# Patient Record
Sex: Female | Born: 1960
Health system: Southern US, Community
[De-identification: ages and names within clinical notes are randomized; demographics above are authoritative.]

## PROBLEM LIST (undated history)

## (undated) DIAGNOSIS — O99891 Other specified diseases and conditions complicating pregnancy: Secondary | ICD-10-CM

## (undated) DIAGNOSIS — Z8619 Personal history of other infectious and parasitic diseases: Secondary | ICD-10-CM

## (undated) DIAGNOSIS — F419 Anxiety disorder, unspecified: Secondary | ICD-10-CM

## (undated) DIAGNOSIS — R011 Cardiac murmur, unspecified: Secondary | ICD-10-CM

## (undated) DIAGNOSIS — N390 Urinary tract infection, site not specified: Secondary | ICD-10-CM

## (undated) DIAGNOSIS — I10 Essential (primary) hypertension: Secondary | ICD-10-CM

## (undated) DIAGNOSIS — O9989 Other specified diseases and conditions complicating pregnancy, childbirth and the puerperium: Secondary | ICD-10-CM

## (undated) HISTORY — DX: Other specified diseases and conditions complicating pregnancy, childbirth and the puerperium: O99.89

## (undated) HISTORY — PX: TONSILLECTOMY: SUR1361

## (undated) HISTORY — DX: Other specified diseases and conditions complicating pregnancy: O99.891

## (undated) HISTORY — DX: Essential (primary) hypertension: I10

## (undated) HISTORY — PX: ECTOPIC PREGNANCY SURGERY: SHX613

## (undated) HISTORY — DX: Anxiety disorder, unspecified: F41.9

## (undated) HISTORY — DX: Cardiac murmur, unspecified: R01.1

## (undated) HISTORY — DX: Personal history of other infectious and parasitic diseases: Z86.19

## (undated) HISTORY — PX: TONSILLECTOMY AND ADENOIDECTOMY: SUR1326

## (undated) HISTORY — DX: Urinary tract infection, site not specified: N39.0

---

## 2006-11-15 ENCOUNTER — Other Ambulatory Visit: Admission: RE | Admit: 2006-11-15 | Discharge: 2006-11-15 | Payer: Self-pay | Admitting: Gynecology

## 2006-11-16 ENCOUNTER — Encounter: Admission: RE | Admit: 2006-11-16 | Discharge: 2006-11-16 | Payer: Self-pay | Admitting: Gynecology

## 2008-11-14 ENCOUNTER — Encounter: Admission: RE | Admit: 2008-11-14 | Discharge: 2008-11-14 | Payer: Self-pay | Admitting: Gynecology

## 2010-09-04 ENCOUNTER — Other Ambulatory Visit: Payer: Self-pay | Admitting: Gynecology

## 2010-09-04 DIAGNOSIS — Z1231 Encounter for screening mammogram for malignant neoplasm of breast: Secondary | ICD-10-CM

## 2010-09-15 ENCOUNTER — Ambulatory Visit
Admission: RE | Admit: 2010-09-15 | Discharge: 2010-09-15 | Disposition: A | Discharge status: Home / Self Care | Payer: 59 | Source: Ambulatory Visit | Attending: Gynecology | Admitting: Gynecology

## 2010-09-15 DIAGNOSIS — Z1231 Encounter for screening mammogram for malignant neoplasm of breast: Secondary | ICD-10-CM

## 2011-05-11 LAB — HM PAP SMEAR: HM Pap smear: NORMAL

## 2011-05-24 LAB — HM MAMMOGRAPHY: HM MAMMO: NORMAL

## 2013-02-27 LAB — HM MAMMOGRAPHY

## 2013-05-22 ENCOUNTER — Telehealth: Payer: Self-pay

## 2013-05-22 NOTE — Telephone Encounter (Signed)
Medication and allergies:  Reviewed and updated  90 day supply/mail order: n/a Local pharmacy:  [facility] [address]     A/P: No changes to personal, family history or past surgical hx PAP-12/2011 per pt CCS-Never had one MMG-12/2011 per pt. Flu-02/07/13 Tdap-Not sure of when she last received vaccine   To Discuss with Provider: Pt. Was given a 14 day supply of Benicar at an Urgent Care.  Ran out of supply.  Would like a prescription.  Pt unsure of dosing.

## 2013-05-23 ENCOUNTER — Ambulatory Visit (INDEPENDENT_AMBULATORY_CARE_PROVIDER_SITE_OTHER): Payer: 59 | Admitting: Family Medicine

## 2013-05-23 ENCOUNTER — Encounter: Payer: Self-pay | Admitting: Family Medicine

## 2013-05-23 VITALS — BP 180/96 | HR 74 | Temp 98.1°F | Resp 16 | Ht 66.0 in | Wt 147.5 lb

## 2013-05-23 DIAGNOSIS — W57XXXA Bitten or stung by nonvenomous insect and other nonvenomous arthropods, initial encounter: Secondary | ICD-10-CM

## 2013-05-23 DIAGNOSIS — T148 Other injury of unspecified body region: Secondary | ICD-10-CM

## 2013-05-23 DIAGNOSIS — R012 Other cardiac sounds: Secondary | ICD-10-CM

## 2013-05-23 DIAGNOSIS — I10 Essential (primary) hypertension: Secondary | ICD-10-CM

## 2013-05-23 LAB — CBC WITH DIFFERENTIAL/PLATELET
BASOS PCT: 0.5 % (ref 0.0–3.0)
Basophils Absolute: 0 10*3/uL (ref 0.0–0.1)
EOS ABS: 0.2 10*3/uL (ref 0.0–0.7)
EOS PCT: 2.6 % (ref 0.0–5.0)
HCT: 36 % (ref 36.0–46.0)
Hemoglobin: 12.1 g/dL (ref 12.0–15.0)
LYMPHS PCT: 31.7 % (ref 12.0–46.0)
Lymphs Abs: 1.9 10*3/uL (ref 0.7–4.0)
MCHC: 33.5 g/dL (ref 30.0–36.0)
MCV: 90 fl (ref 78.0–100.0)
Monocytes Absolute: 0.4 10*3/uL (ref 0.1–1.0)
Monocytes Relative: 6.9 % (ref 3.0–12.0)
NEUTROS PCT: 58.3 % (ref 43.0–77.0)
Neutro Abs: 3.6 10*3/uL (ref 1.4–7.7)
Platelets: 206 10*3/uL (ref 150.0–400.0)
RBC: 4 Mil/uL (ref 3.87–5.11)
RDW: 15.2 % — ABNORMAL HIGH (ref 11.5–14.6)
WBC: 6.1 10*3/uL (ref 4.5–10.5)

## 2013-05-23 LAB — BASIC METABOLIC PANEL
BUN: 7 mg/dL (ref 6–23)
CHLORIDE: 102 meq/L (ref 96–112)
CO2: 27 mEq/L (ref 19–32)
Calcium: 9.2 mg/dL (ref 8.4–10.5)
Creatinine, Ser: 0.9 mg/dL (ref 0.4–1.2)
GFR: 83.01 mL/min (ref >60.00)
Glucose, Bld: 71 mg/dL (ref 70–99)
POTASSIUM: 3.3 meq/L — AB (ref 3.5–5.1)
Sodium: 136 mEq/L (ref 135–145)

## 2013-05-23 LAB — LIPID PANEL
CHOL/HDL RATIO: 2
Cholesterol: 157 mg/dL (ref 0–200)
HDL: 104.6 mg/dL (ref >39.00)
LDL CALC: 39 mg/dL (ref 0–99)
Triglycerides: 68 mg/dL (ref 0.0–149.0)
VLDL: 13.6 mg/dL (ref 0.0–40.0)

## 2013-05-23 LAB — HEPATIC FUNCTION PANEL
ALK PHOS: 54 U/L (ref 39–117)
ALT: 11 U/L (ref 0–35)
AST: 18 U/L (ref 0–37)
Albumin: 4.1 g/dL (ref 3.5–5.2)
BILIRUBIN DIRECT: 0 mg/dL (ref 0.0–0.3)
BILIRUBIN TOTAL: 0.5 mg/dL (ref 0.3–1.2)
Total Protein: 8 g/dL (ref 6.0–8.3)

## 2013-05-23 LAB — TSH: TSH: 1.17 u[IU]/mL (ref 0.35–5.50)

## 2013-05-23 MED ORDER — TRIAMCINOLONE ACETONIDE 0.1 % EX OINT
1.0000 | TOPICAL_OINTMENT | Freq: Two times a day (BID) | CUTANEOUS | Status: DC
Start: 2013-05-23 — End: 2014-08-23

## 2013-05-23 MED ORDER — OLMESARTAN MEDOXOMIL 20 MG PO TABS: 20.0000 mg | ORAL_TABLET | Freq: Every day | ORAL | Status: DC

## 2013-05-23 NOTE — Assessment & Plan Note (Signed)
New to provider, ongoing for pt.  Previously well controlled on Benicar but has since run out of meds.  Restart.  Check labs to risk stratify.  Reviewed supportive care and red flags that should prompt return.  Pt expressed understanding and is in agreement w/ plan.

## 2013-05-23 NOTE — Patient Instructions (Signed)
Follow up in 4-6 weeks to recheck BP Start the Benicar daily We'll notify you of your lab results and make any changes Use the Triamcinolone ointment twice daily on the bug bites Call with any questions or concerns Welcome!  We're glad to have you!!!

## 2013-05-23 NOTE — Progress Notes (Signed)
Pre visit review using our clinic review tool, if applicable. No additional management support is needed unless otherwise documented below in the visit note. 

## 2013-05-23 NOTE — Progress Notes (Signed)
   Subjective:    Patient ID: Abigail Johnson, female    DOB: Aug 21, 1960, 53 y.o.   MRN: [id number]  HPI New to establish.  Moved from [place] and has not had recent PCP.  GYN- Lomax (Retired)  HTN- chronic problem, was started on Benicar at [facility].  Was check BP while on meds and BP was well controlled.  Has since run out of meds and needs to restart.  No CP, SOB, HAs, visual changes, edema.  Rash- noted last week, very itching.  Predominately on L arm and lower back.  Few areas on lower R arm.  Not painful.  No one w/ similar itching.  Pt did travel over the holidays.  Pt slept on grandmother's couch and recently new dog has been lying on same couch.   Review of Systems For ROS see HPI     Objective:   Physical Exam  Vitals reviewed. Constitutional: She is oriented to person, place, and time. She appears well-developed and well-nourished. No distress.  HENT:  Head: Normocephalic and atraumatic.  Eyes: Conjunctivae and EOM are normal. Pupils are equal, round, and reactive to light.  Neck: Normal range of motion. Neck supple. No thyromegaly present.  Cardiovascular: Normal rate, regular rhythm, normal heart sounds and intact distal pulses.   No murmur heard. Pulmonary/Chest: Effort normal and breath sounds normal. No respiratory distress.  Abdominal: Soft. She exhibits no distension. There is no tenderness.  Musculoskeletal: She exhibits no edema.  Lymphadenopathy:    She has no cervical adenopathy.  Neurological: She is alert and oriented to person, place, and time.  Skin: Skin is warm and dry. Rash (scattered maculopapular lesions on L arm, lower back, and R lower arm.  central punctate scab consistent w/ bug bites) noted.  Psychiatric: She has a normal mood and affect. Her behavior is normal.          Assessment & Plan:

## 2013-05-23 NOTE — Assessment & Plan Note (Signed)
New.  Pt aware of dx and will be investigating whether new dog has fleas.  Triamcinolone ointment prn.  Pt expressed understanding and is in agreement w/ plan.

## 2013-05-24 ENCOUNTER — Encounter: Payer: Self-pay | Admitting: General Practice

## 2013-05-24 DIAGNOSIS — R012 Other cardiac sounds: Secondary | ICD-10-CM | POA: Insufficient documentation

## 2013-05-24 NOTE — Assessment & Plan Note (Signed)
Get ECHO to assess.

## 2013-05-24 NOTE — Progress Notes (Signed)
   Subjective:    Patient ID: Abigail Johnson, female    DOB: 11-12-1960, 53 y.o.   MRN: [id number]  HPI    Review of Systems     Objective:   Physical Exam  Cardiovascular:  Very loud S2 w/o murmur heard          Assessment & Plan:

## 2013-05-24 NOTE — Addendum Note (Signed)
Addended by: Midge Minium on: 05/24/2013 11:08 AM   Modules accepted: Orders

## 2013-05-30 ENCOUNTER — Encounter: Payer: Self-pay | Admitting: Cardiology

## 2013-05-30 ENCOUNTER — Ambulatory Visit (HOSPITAL_COMMUNITY): Payer: 59 | Attending: Family Medicine | Admitting: Radiology

## 2013-05-30 DIAGNOSIS — I10 Essential (primary) hypertension: Secondary | ICD-10-CM | POA: Insufficient documentation

## 2013-05-30 DIAGNOSIS — R012 Other cardiac sounds: Secondary | ICD-10-CM | POA: Insufficient documentation

## 2013-05-30 NOTE — Progress Notes (Signed)
Echocardiogram performed.  

## 2013-05-31 ENCOUNTER — Other Ambulatory Visit: Payer: Self-pay | Admitting: Family Medicine

## 2013-06-05 ENCOUNTER — Encounter: Payer: Self-pay | Admitting: General Practice

## 2013-07-02 ENCOUNTER — Encounter: Payer: Self-pay | Admitting: General Practice

## 2013-07-02 ENCOUNTER — Ambulatory Visit (INDEPENDENT_AMBULATORY_CARE_PROVIDER_SITE_OTHER): Payer: 59 | Admitting: Family Medicine

## 2013-07-02 ENCOUNTER — Encounter: Payer: Self-pay | Admitting: Family Medicine

## 2013-07-02 VITALS — BP 144/90 | HR 90 | Temp 98.2°F | Resp 16 | Wt 148.0 lb

## 2013-07-02 DIAGNOSIS — I10 Essential (primary) hypertension: Secondary | ICD-10-CM

## 2013-07-02 LAB — BASIC METABOLIC PANEL
BUN: 14 mg/dL (ref 6–23)
CO2: 25 mEq/L (ref 19–32)
Calcium: 8.9 mg/dL (ref 8.4–10.5)
Chloride: 107 mEq/L (ref 96–112)
Creatinine, Ser: 1 mg/dL (ref 0.4–1.2)
GFR: 78.05 mL/min (ref >60.00)
Glucose, Bld: 89 mg/dL (ref 70–99)
POTASSIUM: 3.8 meq/L (ref 3.5–5.1)
SODIUM: 137 meq/L (ref 135–145)

## 2013-07-02 MED ORDER — OLMESARTAN MEDOXOMIL 40 MG PO TABS: 40.0000 mg | ORAL_TABLET | Freq: Every day | ORAL | Status: DC

## 2013-07-02 NOTE — Assessment & Plan Note (Signed)
Improved since restarting Benicar but not at goal.  Will increase to 40mg  daily.  Repeat BMP to ensure normal K+ and Cr.  Will follow closely.

## 2013-07-02 NOTE — Progress Notes (Signed)
Pre visit review using our clinic review tool, if applicable. No additional management support is needed unless otherwise documented below in the visit note. 

## 2013-07-02 NOTE — Patient Instructions (Signed)
Follow up in 6 weeks for BP check Increase the Benicar to 40mg - 2 of what you have at home, 1 of the new prescription Keep up the good work!  You look great! Call with any questions or concerns [place]!

## 2013-07-02 NOTE — Progress Notes (Signed)
   Subjective:    Patient ID: Abigail Johnson, female    DOB: April 11, 1961, 53 y.o.   MRN: [id number]  HPI HTN- chronic problem, restarted Benicar at last visit.  BP is much improved.  No HAs, visual changes, CP, SOB, edema.   Review of Systems For ROS see HPI     Objective:   Physical Exam  Vitals reviewed. Constitutional: She is oriented to person, place, and time. She appears well-developed and well-nourished. No distress.  HENT:  Head: Normocephalic and atraumatic.  Eyes: Conjunctivae and EOM are normal. Pupils are equal, round, and reactive to light.  Neck: Normal range of motion. Neck supple. No thyromegaly present.  Cardiovascular: Normal rate, regular rhythm, normal heart sounds and intact distal pulses.   No murmur heard. Pulmonary/Chest: Effort normal and breath sounds normal. No respiratory distress.  Abdominal: Soft. She exhibits no distension. There is no tenderness.  Musculoskeletal: She exhibits no edema.  Lymphadenopathy:    She has no cervical adenopathy.  Neurological: She is alert and oriented to person, place, and time.  Skin: Skin is warm and dry.  Psychiatric: She has a normal mood and affect. Her behavior is normal.          Assessment & Plan:

## 2013-07-16 ENCOUNTER — Encounter: Payer: Self-pay | Admitting: General Practice

## 2013-08-29 ENCOUNTER — Ambulatory Visit (INDEPENDENT_AMBULATORY_CARE_PROVIDER_SITE_OTHER): Payer: 59 | Admitting: Internal Medicine

## 2013-08-29 ENCOUNTER — Encounter: Payer: Self-pay | Admitting: Internal Medicine

## 2013-08-29 VITALS — BP 168/108 | HR 69 | Temp 98.3°F | Wt 150.0 lb

## 2013-08-29 DIAGNOSIS — T148 Other injury of unspecified body region: Secondary | ICD-10-CM

## 2013-08-29 DIAGNOSIS — I10 Essential (primary) hypertension: Secondary | ICD-10-CM

## 2013-08-29 DIAGNOSIS — W57XXXA Bitten or stung by nonvenomous insect and other nonvenomous arthropods, initial encounter: Secondary | ICD-10-CM

## 2013-08-29 MED ORDER — PREDNISONE 10 MG PO TABS: 20.0000 mg | ORAL_TABLET | Freq: Every day | ORAL | Status: DC

## 2013-08-29 NOTE — Progress Notes (Signed)
   Subjective:    Patient ID: Abigail Johnson, female    DOB: [birth year], 53 y.o.   MRN: [id number]  DOS:  08/29/2013 Type of  visit: Acute visit Having hives, symptoms have been consistently after she visit her mother who lives in [place]. Symptoms started in December after her mom acquired a dog.. Typically, she developed hives, the next day they hive looks bigger and developed a small blister on top. Swelling gradually goes away    BP is noted to be elevated, see assessment and plan.   ROS Denies fever chills No joint aches No lip -tonge swelling.   Past Medical History  Diagnosis Date  . Hypertension   . History of chicken pox   . Heart murmur   . Blood transfusion complicating pregnancy   . UTI (urinary tract infection)     Past Surgical History  Procedure Laterality Date  . Tonsillectomy    . Tonsillectomy and adenoidectomy      History   Social History  . Marital Status: Single    Spouse Name: N/A    Number of Children: N/A  . Years of Education: N/A   Occupational History  . Not on file.   Social History Main Topics  . Smoking status: Never Smoker   . Smokeless tobacco: Not on file  . Alcohol Use: Yes  . Drug Use: No  . Sexual Activity: Yes   Other Topics Concern  . Not on file   Social History Narrative  . No narrative on file        Medication List       This list is accurate as of: 08/29/13  6:48 PM.  Always use your most recent med list.               olmesartan 40 MG tablet  Commonly known as:  BENICAR  Take 1 tablet (40 mg total) by mouth daily.     predniSONE 10 MG tablet  Commonly known as:  DELTASONE  Take 2 tablets (20 mg total) by mouth daily with breakfast.     triamcinolone ointment 0.1 %  Commonly known as:  KENALOG  Apply 1 application topically 2 (two) times daily.           Objective:   Physical Exam BP 168/108  Pulse 69  Temp(Src) 98.3 F (36.8 C)  Wt 150 lb (68.04 kg)  SpO2 100% General -- alert,  well-developed, NAD.  Skin-- Few lesions in different stages: some hives, other hives with a small blister in the center, some postinflammatory hyperpigmentation where she used to have a hive Psych-- Cognition and judgment appear intact. Cooperative with normal attention span and concentration. No anxious or depressed appearing.        Assessment & Plan:

## 2013-08-29 NOTE — Assessment & Plan Note (Signed)
BP slightly elevated today, ambulatory BPs per patient consistently 130/80. Of note she is not taking birth control pills, she is aware can not take benicar if pregnant

## 2013-08-29 NOTE — Progress Notes (Signed)
Pre visit review using our clinic review tool, if applicable. No additional management support is needed unless otherwise documented below in the visit note. 

## 2013-08-29 NOTE — Patient Instructions (Signed)
Take prednisone as prescribed for a few days Also recommend Claritin 10 mg OTC one tablet daily as needed Zantac 75 mg OTC 2 tablets daily as needed Both are excellent allergy medications that you should take when  you have problems.  If he ever have a severe reaction: Facial swelling, lip or tongue swelling or difficulty breathing ---> go to the ER   Check the  blood pressure 2 or 3 times a  week be sure it is between 110/60 and 140/85. Ideal blood pressure is 120/80. If it is consistently higher or lower, let me know

## 2013-08-29 NOTE — Assessment & Plan Note (Signed)
Hives vs flea bites  Symptoms started before she restarted BP medication, doubt related to benicar. Likely related to flea bites-bed bugs as they happen when she visit her mother. Recommend to get her mother's  pet and  house inspected. See instructions

## 2013-11-01 ENCOUNTER — Ambulatory Visit: Payer: 59 | Admitting: Family Medicine

## 2014-05-22 ENCOUNTER — Telehealth: Payer: Self-pay | Admitting: Family Medicine

## 2014-05-22 ENCOUNTER — Other Ambulatory Visit (HOSPITAL_COMMUNITY)
Admission: RE | Admit: 2014-05-22 | Discharge: 2014-05-22 | Disposition: A | Discharge status: Home / Self Care | Payer: 59 | Source: Ambulatory Visit | Attending: Medical | Admitting: Medical

## 2014-05-22 ENCOUNTER — Encounter: Payer: Self-pay | Admitting: Medical

## 2014-05-22 ENCOUNTER — Ambulatory Visit (INDEPENDENT_AMBULATORY_CARE_PROVIDER_SITE_OTHER): Payer: 59 | Admitting: Medical

## 2014-05-22 VITALS — BP 223/132 | HR 75 | Temp 97.0°F | Ht 66.0 in | Wt 151.0 lb

## 2014-05-22 DIAGNOSIS — I10 Essential (primary) hypertension: Secondary | ICD-10-CM

## 2014-05-22 DIAGNOSIS — N760 Acute vaginitis: Secondary | ICD-10-CM | POA: Insufficient documentation

## 2014-05-22 DIAGNOSIS — N3000 Acute cystitis without hematuria: Secondary | ICD-10-CM

## 2014-05-22 DIAGNOSIS — Z113 Encounter for screening for infections with a predominantly sexual mode of transmission: Secondary | ICD-10-CM | POA: Insufficient documentation

## 2014-05-22 DIAGNOSIS — N390 Urinary tract infection, site not specified: Secondary | ICD-10-CM | POA: Insufficient documentation

## 2014-05-22 DIAGNOSIS — R350 Frequency of micturition: Secondary | ICD-10-CM

## 2014-05-22 DIAGNOSIS — N898 Other specified noninflammatory disorders of vagina: Secondary | ICD-10-CM

## 2014-05-22 LAB — COMPREHENSIVE METABOLIC PANEL
ALT: 13 U/L (ref 0–35)
AST: 18 U/L (ref 0–37)
Albumin: 4 g/dL (ref 3.5–5.2)
Alkaline Phosphatase: 49 U/L (ref 39–117)
BILIRUBIN TOTAL: 0.4 mg/dL (ref 0.2–1.2)
BUN: 12 mg/dL (ref 6–23)
CO2: 25 meq/L (ref 19–32)
CREATININE: 0.85 mg/dL (ref 0.40–1.20)
Calcium: 9.1 mg/dL (ref 8.4–10.5)
Chloride: 103 mEq/L (ref 96–112)
GFR: 90.58 mL/min (ref >60.00)
GLUCOSE: 87 mg/dL (ref 70–99)
Potassium: 3.8 mEq/L (ref 3.5–5.1)
Sodium: 134 mEq/L — ABNORMAL LOW (ref 135–145)
Total Protein: 7.7 g/dL (ref 6.0–8.3)

## 2014-05-22 LAB — CBC WITH DIFFERENTIAL/PLATELET
BASOS PCT: 0.5 % (ref 0.0–3.0)
Basophils Absolute: 0 10*3/uL (ref 0.0–0.1)
EOS ABS: 0.1 10*3/uL (ref 0.0–0.7)
EOS PCT: 2.7 % (ref 0.0–5.0)
HEMATOCRIT: 35.5 % — AB (ref 36.0–46.0)
Hemoglobin: 11.6 g/dL — ABNORMAL LOW (ref 12.0–15.0)
LYMPHS ABS: 1.6 10*3/uL (ref 0.7–4.0)
Lymphocytes Relative: 32.2 % (ref 12.0–46.0)
MCHC: 32.8 g/dL (ref 30.0–36.0)
MCV: 90.3 fl (ref 78.0–100.0)
MONO ABS: 0.4 10*3/uL (ref 0.1–1.0)
Monocytes Relative: 8.6 % (ref 3.0–12.0)
Neutro Abs: 2.8 10*3/uL (ref 1.4–7.7)
Neutrophils Relative %: 56 % (ref 43.0–77.0)
PLATELETS: 195 10*3/uL (ref 150.0–400.0)
RBC: 3.93 Mil/uL (ref 3.87–5.11)
RDW: 15.5 % (ref 11.5–15.5)
WBC: 5 10*3/uL (ref 4.0–10.5)

## 2014-05-22 LAB — POCT URINALYSIS DIPSTICK
Glucose, UA: NEGATIVE
KETONES UA: NEGATIVE
Nitrite, UA: NEGATIVE
PH UA: 6
UROBILINOGEN UA: 0.2

## 2014-05-22 MED ORDER — CIPROFLOXACIN HCL 250 MG PO TABS: 250.0000 mg | ORAL_TABLET | Freq: Two times a day (BID) | ORAL | Status: DC

## 2014-05-22 MED ORDER — OLMESARTAN MEDOXOMIL-HCTZ 40-12.5 MG PO TABS: 1.0000 | ORAL_TABLET | Freq: Every day | ORAL | Status: DC

## 2014-05-22 MED ORDER — FLUCONAZOLE 150 MG PO TABS: 150.0000 mg | ORAL_TABLET | Freq: Once | ORAL | Status: DC

## 2014-05-22 NOTE — Assessment & Plan Note (Signed)
You do appear to have possible uti and has some symptoms consistent with last week. I will rx cipro for 3 days pending urine culture. Also will get urine ancillary studies. If yeast infection symptoms with antibiotic then prescribing diflucan.(making this available).

## 2014-05-22 NOTE — Telephone Encounter (Signed)
Caller name: Louanna, Vanliew Relationship to patient: self  Can be reached: [phone] [id number] [place], [place]. [phone] (Phone)     Reason for call: Pt states antibiotics was not sent over for her bladder infection

## 2014-05-22 NOTE — Progress Notes (Signed)
Subjective:    Patient ID: Abigail Johnson, female    DOB: [birth year], 54 y.o.   MRN: [id number]  HPI    Pt in today reporting urinary symptoms. Pt states one week had sensation of frequent urination but mild hesitant flow. Frequent urination. Those symptoms did resolve over the weekend. But still has odor to her urine. If she drinks a lot of water odor is diminished and looks clearer.    Past Medical History  Diagnosis Date  . Hypertension   . History of chicken pox   . Heart murmur   . Blood transfusion complicating pregnancy   . UTI (urinary tract infection)     History   Social History  . Marital Status: Single    Spouse Name: N/A    Number of Children: N/A  . Years of Education: N/A   Occupational History  . Not on file.   Social History Main Topics  . Smoking status: Never Smoker   . Smokeless tobacco: Not on file  . Alcohol Use: Yes  . Drug Use: No  . Sexual Activity: Yes   Other Topics Concern  . Not on file   Social History Narrative    Past Surgical History  Procedure Laterality Date  . Tonsillectomy    . Tonsillectomy and adenoidectomy      Family History  Problem Relation Age of Onset  . Hypertension Mother   . Diabetes Father   . Prostate cancer Paternal Grandfather     Allergies  Allergen Reactions  . Compazine [Prochlorperazine Maleate] Other (See Comments)    Muscle tension    Current Outpatient Prescriptions on File Prior to Visit  Medication Sig Dispense Refill  . olmesartan (BENICAR) 40 MG tablet Take 1 tablet (40 mg total) by mouth daily. 30 tablet 6  . predniSONE (DELTASONE) 10 MG tablet Take 2 tablets (20 mg total) by mouth daily with breakfast. 10 tablet 0  . triamcinolone ointment (KENALOG) 0.1 % Apply 1 application topically 2 (two) times daily. 90 g 1   No current facility-administered medications on file prior to visit.    BP 223/132 mmHg  Pulse 75  Temp(Src) 97 F (36.1 C) (Oral)  Ht 5\' 6"  (1.676 m)  Wt 151 lb  (68.493 kg)  BMI 24.38 kg/m2  SpO2 100%  LMP 05/11/2014    Dysuria- yes but now resolved. Frequent urination-Yes but now resolved. Hesitancy- Yes but that now resolved. Suprapubic pressure-No Fever-no chills-no Nausea-no Vomiting-no CVA pain-no History of UTI-2 urinary infection in the past. But 20 years ago Gross hematuria- No  See ros. On review of symptoms no cardiac signs or symptoms.  Pt states she checks her bp at work daily and her bp is 160-165/85-90. Yesterday 170/90. Pt states that her bp are always high here and pcp is aware of elevated readings here and her at work bp trends.  Pt ran out of her bp medication last month. And has appointment with Dr. Birdie Riddle in a month.  Previous was on benciar/hctz. She declined side effects. Not aware k was low. But has hx of that in the chart.    Review of Systems  Constitutional: Negative for fever, chills and fatigue.  Respiratory: Negative for cough, chest tightness, shortness of breath and wheezing.   Cardiovascular: Negative for chest pain and palpitations.  Gastrointestinal: Negative for abdominal pain.  Genitourinary: Positive for difficulty urinating. Negative for dysuria, frequency, hematuria, flank pain, vaginal bleeding and vaginal pain.       Urine odor  now. But did have dysuria, frequency, and hesitancy last week.   A little bit white discharge. But no icthing.  Neurological: Negative for dizziness, tremors, seizures, syncope, facial asymmetry, speech difficulty, weakness, light-headedness, numbness and headaches.  Hematological: Negative for adenopathy. Does not bruise/bleed easily.  Psychiatric/Behavioral: Negative for behavioral problems and confusion.       Objective:   Physical Exam    General Appearance- Not in acute distress.  HEENT Eyes- Scleraeral/Conjuntiva-bilat- Not Yellow. Mouth & Throat- Normal.  Chest and Lung Exam Auscultation: Breath sounds:-Normal. Adventitious sounds:- No  Adventitious sounds.  Cardiovascular Auscultation:Rythm - Regular. Heart Sounds -Normal heart sounds.  Abdomen Inspection:-Inspection Normal.  Palpation/Perucssion: Palpation and Percussion of the abdomen reveal- Non Tender, No Rebound tenderness, No rigidity(Guarding) and No Palpable abdominal masses.  Liver:-Normal.  Spleen:- Normal.   Back- no cva tenderness.  Neurologic Cranial Nerve exam:- CN III-XII intact(No nystagmus), symmetric smile. Drift Test:- No drift. Romberg Exam:- Negative.  Heal to Toe Gait exam:-Normal. Finger to Nose:- Normal/Intact Strength:- 5/5 equal and symmetric strength both upper and lower extremities.        Assessment & Plan:

## 2014-05-22 NOTE — Assessment & Plan Note (Signed)
With your blood pressure as high as it is I will get cmp today and start you back on benicar but add hctz. Make sure you are eating banana at least every other day to keep your k up.  If you get any cardiac or neuro signs or symptoms then ED eval.  Please call us tomorrow with at work bp readings. Those are lower and you have some reported degree of white coat hypertension.

## 2014-05-22 NOTE — Patient Instructions (Signed)
You do appear to have possible uti and has some symptoms consistent with last week. I will rx cipro for 3 days pending urine culture. Also will get urine ancillary studies. If yeast infection symptoms with antibiotic then prescribing diflucan.(making this available).  With your blood pressure as high as it is I will get cmp today and start you back on benicar but add hctz. Make sure you are eating banana at least every other day to keep your k up.  If you get any cardiac or neuro signs or symptoms then ED eval.  Please call us tomorrow with at work bp readings. Those are lower and you have some reported degree of white coat hypertension.   Follow up in 1 wk or as needed.

## 2014-05-22 NOTE — Progress Notes (Signed)
Pre visit review using our clinic review tool, if applicable. No additional management support is needed unless otherwise documented below in the visit note. 

## 2014-05-23 ENCOUNTER — Encounter: Payer: Self-pay | Admitting: Medical

## 2014-05-23 LAB — URINE CYTOLOGY ANCILLARY ONLY
CHLAMYDIA, DNA PROBE: NEGATIVE
NEISSERIA GONORRHEA: NEGATIVE
TRICH (WINDOWPATH): NEGATIVE

## 2014-05-23 NOTE — Telephone Encounter (Signed)
After review of pt bp I want her to come in tomorrow for eval. Will likely need to add other type med. Will send lpn note asking pt to come in on 15th.

## 2014-05-23 NOTE — Telephone Encounter (Signed)
Patient scheduled for 03/25/15 at 8:30.

## 2014-05-23 NOTE — Progress Notes (Unsigned)
Patient seen by E. Saguier on yesterday. Advised to call back with BP reading after she got back to work. Patient called with BP reading taken at 12:35 am = 189/120. Forwarded information to E. Prague for review.

## 2014-05-24 ENCOUNTER — Ambulatory Visit (INDEPENDENT_AMBULATORY_CARE_PROVIDER_SITE_OTHER): Payer: 59 | Admitting: Medical

## 2014-05-24 ENCOUNTER — Encounter: Payer: Self-pay | Admitting: Medical

## 2014-05-24 VITALS — BP 170/110 | HR 72 | Temp 98.4°F | Ht 66.0 in | Wt 148.0 lb

## 2014-05-24 DIAGNOSIS — I10 Essential (primary) hypertension: Secondary | ICD-10-CM

## 2014-05-24 LAB — URINE CULTURE

## 2014-05-24 LAB — URINE CYTOLOGY ANCILLARY ONLY: CANDIDA VAGINITIS: NEGATIVE

## 2014-05-24 MED ORDER — AMLODIPINE BESYLATE 10 MG PO TABS: 10.0000 mg | ORAL_TABLET | Freq: Every day | ORAL | Status: DC

## 2014-05-24 NOTE — Progress Notes (Signed)
Subjective:    Patient ID: Abigail Johnson, female    DOB: December 26, 1960, 54 y.o.   MRN: [id number]  HPI    Pt states last night her bp was 170/110. So some improvement but bp quite high recently as high as 223/132. I gave her extra 12.5 mg hctz. Pt has no cardiac or neurologic signs or symptoms.  Pt states in the past she only tried benicar/hctz combo.   Past Medical History  Diagnosis Date  . Hypertension   . History of chicken pox   . Heart murmur   . Blood transfusion complicating pregnancy   . UTI (urinary tract infection)     History   Social History  . Marital Status: Single    Spouse Name: N/A    Number of Children: N/A  . Years of Education: N/A   Occupational History  . Not on file.   Social History Main Topics  . Smoking status: Never Smoker   . Smokeless tobacco: Not on file  . Alcohol Use: Yes  . Drug Use: No  . Sexual Activity: Yes   Other Topics Concern  . Not on file   Social History Narrative    Past Surgical History  Procedure Laterality Date  . Tonsillectomy    . Tonsillectomy and adenoidectomy      Family History  Problem Relation Age of Onset  . Hypertension Mother   . Diabetes Father   . Prostate cancer Paternal Grandfather     Allergies  Allergen Reactions  . Compazine [Prochlorperazine Maleate] Other (See Comments)    Muscle tension    Current Outpatient Prescriptions on File Prior to Visit  Medication Sig Dispense Refill  . ciprofloxacin (CIPRO) 250 MG tablet Take 1 tablet (250 mg total) by mouth 2 (two) times daily. 6 tablet 0  . olmesartan-hydrochlorothiazide (BENICAR HCT) 40-12.5 MG per tablet Take 1 tablet by mouth daily. 30 tablet 3  . fluconazole (DIFLUCAN) 150 MG tablet Take 1 tablet (150 mg total) by mouth once. (Patient not taking: Reported on 05/24/2014) 1 tablet 0  . olmesartan (BENICAR) 40 MG tablet Take 1 tablet (40 mg total) by mouth daily. (Patient not taking: Reported on 05/24/2014) 30 tablet 6  . predniSONE  (DELTASONE) 10 MG tablet Take 2 tablets (20 mg total) by mouth daily with breakfast. (Patient not taking: Reported on 05/24/2014) 10 tablet 0  . triamcinolone ointment (KENALOG) 0.1 % Apply 1 application topically 2 (two) times daily. (Patient not taking: Reported on 05/24/2014) 90 g 1   No current facility-administered medications on file prior to visit.    BP 192/112 mmHg  Pulse 72  Temp(Src) 98.4 F (36.9 C) (Oral)  Ht 5\' 6"  (1.676 m)  Wt 148 lb (67.132 kg)  BMI 23.90 kg/m2  SpO2 97%  LMP 05/11/2014      Review of Systems  Constitutional: Negative for fever, chills, diaphoresis, activity change and fatigue.  Respiratory: Negative for cough, chest tightness and shortness of breath.   Cardiovascular: Negative for chest pain, palpitations and leg swelling.  Gastrointestinal: Negative for nausea, vomiting and abdominal pain.  Musculoskeletal: Negative for neck pain and neck stiffness.  Neurological: Negative for dizziness, tremors, seizures, syncope, facial asymmetry, speech difficulty, weakness, light-headedness, numbness and headaches.  Psychiatric/Behavioral: Negative for behavioral problems, confusion and agitation. The patient is not nervous/anxious.        Objective:   Physical Exam   General Mental Status- Alert. General Appearance- Not in acute distress.   Skin General: Color- Normal Color.  Moisture- Normal Moisture.  Neck Carotid Arteries- Normal color. Moisture- Normal Moisture. No carotid bruits. No JVD.  Chest and Lung Exam Auscultation: Breath Sounds:-Normal.  Cardiovascular Auscultation:Rythm- Regular. Murmurs & Other Heart Sounds:Auscultation of the heart reveals- No Murmurs.  Neurologic Cranial Nerve exam:- CN III-XII intact(No nystagmus), symmetric smile. Drift Test:- No drift. Romberg Exam:- Negative.  Heal to Toe Gait exam:-Normal. Finger to Nose:- Normal/Intact Strength:- 5/5 equal and symmetric strength both upper and lower  extremities.        Assessment & Plan:

## 2014-05-24 NOTE — Patient Instructions (Addendum)
Your bp is a lot better than the other day. I do think you would benefit from adding other medication and continuing with benicar/hctz. Adding amlodipine today.   Follow up Wednesday for bp check or as needed.  If any neurologic signs or symptoms after hour then ED eval. This is much less likely now that your bp is dropping.

## 2014-05-24 NOTE — Progress Notes (Signed)
Pre visit review using our clinic review tool, if applicable. No additional management support is needed unless otherwise documented below in the visit note. 

## 2014-05-24 NOTE — Assessment & Plan Note (Signed)
Improved but still quite high. So I added amlodipine 10 mg q day to current regimen. Follow up on this coming wed.

## 2014-05-29 ENCOUNTER — Ambulatory Visit: Payer: 59 | Admitting: Medical

## 2014-08-23 ENCOUNTER — Encounter: Payer: Self-pay | Admitting: Family Medicine

## 2014-08-23 ENCOUNTER — Other Ambulatory Visit: Payer: Self-pay | Admitting: General Practice

## 2014-08-23 ENCOUNTER — Ambulatory Visit (INDEPENDENT_AMBULATORY_CARE_PROVIDER_SITE_OTHER): Payer: 59 | Admitting: Family Medicine

## 2014-08-23 VITALS — BP 128/78 | HR 72 | Temp 98.0°F | Resp 16 | Wt 150.5 lb

## 2014-08-23 DIAGNOSIS — I10 Essential (primary) hypertension: Secondary | ICD-10-CM | POA: Diagnosis not present

## 2014-08-23 LAB — BASIC METABOLIC PANEL
BUN: 13 mg/dL (ref 6–23)
CALCIUM: 9.6 mg/dL (ref 8.4–10.5)
CHLORIDE: 101 meq/L (ref 96–112)
CO2: 26 meq/L (ref 19–32)
Creatinine, Ser: 1.05 mg/dL (ref 0.40–1.20)
GFR: 70.91 mL/min (ref >60.00)
Glucose, Bld: 70 mg/dL (ref 70–99)
Potassium: 3.5 mEq/L (ref 3.5–5.1)
SODIUM: 134 meq/L — AB (ref 135–145)

## 2014-08-23 MED ORDER — OLMESARTAN MEDOXOMIL-HCTZ 40-12.5 MG PO TABS: 1.0000 | ORAL_TABLET | Freq: Every day | ORAL | Status: DC

## 2014-08-23 NOTE — Assessment & Plan Note (Signed)
Chronic problem.  BP much improved since increasing Benicar to 40mg  daily.  Check BMP to ensure normal K+ and Cr.  Applauded pt's efforts.  No anticipated med changes.  Will follow.

## 2014-08-23 NOTE — Progress Notes (Signed)
Pre visit review using our clinic review tool, if applicable. No additional management support is needed unless otherwise documented below in the visit note. 

## 2014-08-23 NOTE — Patient Instructions (Signed)
Schedule your complete physical in 6 months We'll notify you of your lab results and make any changes if needed Keep up the good work!  You look great! Call with any questions or concerns [place]!!! 

## 2014-08-23 NOTE — Progress Notes (Signed)
   Subjective:    Patient ID: Forest Becker, female    DOB: [birth year], 54 y.o.   MRN: [id number]  HPI HTN- chronic problem.  BP is well controlled today since doubling the Benicar to 40mg  last visit.  No CP, SOB, HAs, visual changes, edema.   Review of Systems For ROS see HPI     Objective:   Physical Exam  Constitutional: She is oriented to person, place, and time. She appears well-developed and well-nourished. No distress.  HENT:  Head: Normocephalic and atraumatic.  Eyes: Conjunctivae and EOM are normal. Pupils are equal, round, and reactive to light.  Neck: Normal range of motion. Neck supple. No thyromegaly present.  Cardiovascular: Normal rate, regular rhythm, normal heart sounds and intact distal pulses.   No murmur heard. Pulmonary/Chest: Effort normal and breath sounds normal. No respiratory distress.  Abdominal: Soft. She exhibits no distension. There is no tenderness.  Musculoskeletal: She exhibits no edema.  Lymphadenopathy:    She has no cervical adenopathy.  Neurological: She is alert and oriented to person, place, and time.  Skin: Skin is warm and dry.  Psychiatric: She has a normal mood and affect. Her behavior is normal.  Vitals reviewed.         Assessment & Plan:

## 2015-02-07 ENCOUNTER — Telehealth: Payer: Self-pay | Admitting: Family Medicine

## 2015-02-07 NOTE — Telephone Encounter (Signed)
pre visit letter mailed 02/07/15 °

## 2015-02-27 ENCOUNTER — Telehealth: Payer: Self-pay | Admitting: Behavioral Health

## 2015-02-27 NOTE — Telephone Encounter (Signed)
Unable to reach patient at time of Pre-Visit Call.  The patient's voice mailbox is full; could not leave a message.

## 2015-02-28 ENCOUNTER — Ambulatory Visit (INDEPENDENT_AMBULATORY_CARE_PROVIDER_SITE_OTHER): Payer: 59 | Admitting: Family Medicine

## 2015-02-28 ENCOUNTER — Other Ambulatory Visit: Payer: Self-pay | Admitting: General Practice

## 2015-02-28 ENCOUNTER — Encounter: Payer: Self-pay | Admitting: Family Medicine

## 2015-02-28 VITALS — BP 124/84 | HR 60 | Temp 98.0°F | Resp 16 | Ht 66.0 in | Wt 153.4 lb

## 2015-02-28 DIAGNOSIS — Z0000 Encounter for general adult medical examination without abnormal findings: Secondary | ICD-10-CM | POA: Insufficient documentation

## 2015-02-28 DIAGNOSIS — Z1231 Encounter for screening mammogram for malignant neoplasm of breast: Secondary | ICD-10-CM

## 2015-02-28 DIAGNOSIS — L723 Sebaceous cyst: Secondary | ICD-10-CM

## 2015-02-28 DIAGNOSIS — Z1211 Encounter for screening for malignant neoplasm of colon: Secondary | ICD-10-CM

## 2015-02-28 DIAGNOSIS — Z01419 Encounter for gynecological examination (general) (routine) without abnormal findings: Secondary | ICD-10-CM

## 2015-02-28 LAB — CBC WITH DIFFERENTIAL/PLATELET
BASOS ABS: 0 10*3/uL (ref 0.0–0.1)
Basophils Relative: 0.6 % (ref 0.0–3.0)
EOS ABS: 0.1 10*3/uL (ref 0.0–0.7)
Eosinophils Relative: 3 % (ref 0.0–5.0)
HEMATOCRIT: 35.8 % — AB (ref 36.0–46.0)
HEMOGLOBIN: 11.8 g/dL — AB (ref 12.0–15.0)
LYMPHS PCT: 34.1 % (ref 12.0–46.0)
Lymphs Abs: 1.6 10*3/uL (ref 0.7–4.0)
MCHC: 32.9 g/dL (ref 30.0–36.0)
MCV: 92.1 fl (ref 78.0–100.0)
Monocytes Absolute: 0.3 10*3/uL (ref 0.1–1.0)
Monocytes Relative: 7.2 % (ref 3.0–12.0)
Neutro Abs: 2.7 10*3/uL (ref 1.4–7.7)
Neutrophils Relative %: 55.1 % (ref 43.0–77.0)
Platelets: 188 10*3/uL (ref 150.0–400.0)
RBC: 3.88 Mil/uL (ref 3.87–5.11)
RDW: 15.4 % (ref 11.5–15.5)
WBC: 4.8 10*3/uL (ref 4.0–10.5)

## 2015-02-28 LAB — HEPATIC FUNCTION PANEL
ALT: 10 U/L (ref 0–35)
AST: 16 U/L (ref 0–37)
Albumin: 4 g/dL (ref 3.5–5.2)
Alkaline Phosphatase: 43 U/L (ref 39–117)
Bilirubin, Direct: 0.1 mg/dL (ref 0.0–0.3)
TOTAL PROTEIN: 7.6 g/dL (ref 6.0–8.3)
Total Bilirubin: 0.5 mg/dL (ref 0.2–1.2)

## 2015-02-28 LAB — TSH: TSH: 1.44 u[IU]/mL (ref 0.35–4.50)

## 2015-02-28 LAB — BASIC METABOLIC PANEL
BUN: 10 mg/dL (ref 6–23)
CALCIUM: 9.4 mg/dL (ref 8.4–10.5)
CO2: 26 meq/L (ref 19–32)
CREATININE: 0.96 mg/dL (ref 0.40–1.20)
Chloride: 102 mEq/L (ref 96–112)
GFR: 78.48 mL/min (ref >60.00)
GLUCOSE: 86 mg/dL (ref 70–99)
Potassium: 4 mEq/L (ref 3.5–5.1)
Sodium: 135 mEq/L (ref 135–145)

## 2015-02-28 LAB — LIPID PANEL
Cholesterol: 139 mg/dL (ref 0–200)
HDL: 86.4 mg/dL (ref >39.00)
LDL Cholesterol: 42 mg/dL (ref 0–99)
NONHDL: 52.47
Total CHOL/HDL Ratio: 2
Triglycerides: 53 mg/dL (ref 0.0–149.0)
VLDL: 10.6 mg/dL (ref 0.0–40.0)

## 2015-02-28 LAB — VITAMIN D 25 HYDROXY (VIT D DEFICIENCY, FRACTURES): VITD: 13.71 ng/mL — ABNORMAL LOW (ref 30.00–100.00)

## 2015-02-28 MED ORDER — VITAMIN D (ERGOCALCIFEROL) 1.25 MG (50000 UNIT) PO CAPS: 50000.0000 [IU] | ORAL_CAPSULE | ORAL | Status: DC

## 2015-02-28 NOTE — Assessment & Plan Note (Signed)
Pt's PE WNL.  Due for mammo- ordered, pap- referred, colonoscopy- referred.  EKG done as baseline today- WNL.  Pt gets flu shot at work.  Check labs.  Anticipatory guidance provided.

## 2015-02-28 NOTE — Patient Instructions (Signed)
Follow up in 6 months to recheck BP We'll notify you of your lab results and make any changes if needed Keep up the good work on healthy diet and regular exercise- you look great! We'll call you with your dermatology, GYN, and GI appts We'll call you with your mammogram appt Call with any questions or concerns If you want to join [place] at the new [place] office, any scheduled appointments will automatically transfer and we will see you at [address] [place] [address] [place], [place] [address] [place]!!

## 2015-02-28 NOTE — Progress Notes (Signed)
Pre visit review using our clinic review tool, if applicable. No additional management support is needed unless otherwise documented below in the visit note. 

## 2015-02-28 NOTE — Progress Notes (Signed)
   Subjective:    Patient ID: Abigail Johnson, female    DOB: 07/26/1960, 54 y.o.   MRN: [id number]  HPI CPE- due for pap (pt wants referral to GYN).  Overdue on mammo [facility]).  Has never had colonoscopy.   Review of Systems Patient reports no vision/ hearing changes, adenopathy,fever, weight change,  persistant/recurrent hoarseness , swallowing issues, chest pain, palpitations, edema, persistant/recurrent cough, hemoptysis, dyspnea (rest/exertional/paroxysmal nocturnal), gastrointestinal bleeding (melena, rectal bleeding), abdominal pain, significant heartburn, bowel changes, GU symptoms (dysuria, hematuria, incontinence), Gyn symptoms (abnormal  bleeding, pain),  syncope, focal weakness, memory loss, numbness & tingling, hair/nail changes, abnormal bruising or bleeding, anxiety, or depression.   + sebaceous cyst of R shoulder    Objective:   Physical Exam General Appearance:    Alert, cooperative, no distress, appears stated age  Head:    Normocephalic, without obvious abnormality, atraumatic  Eyes:    PERRL, conjunctiva/corneas clear, EOM's intact, fundi    benign, both eyes  Ears:    Normal TM's and external ear canals, both ears  Nose:   Nares normal, septum midline, mucosa normal, no drainage    or sinus tenderness  Throat:   Lips, mucosa, and tongue normal; teeth and gums normal  Neck:   Supple, symmetrical, trachea midline, no adenopathy;    Thyroid: no enlargement/tenderness/nodules  Back:     Symmetric, no curvature, ROM normal, no CVA tenderness  Lungs:     Clear to auscultation bilaterally, respirations unlabored  Chest Wall:    No tenderness or deformity   Heart:    Regular rate and rhythm, S1 and S2 normal, no murmur, rub   or gallop  Breast Exam:    Deferred to GYN  Abdomen:     Soft, non-tender, bowel sounds active all four quadrants,    no masses, no organomegaly  Genitalia:    Deferred to GYN  Rectal:    Extremities:   Extremities normal, atraumatic, no  cyanosis or edema  Pulses:   2+ and symmetric all extremities  Skin:   Skin color, texture, turgor normal, no rashes or lesions.  Small sebaceous cyst on R shoulder  Lymph nodes:   Cervical, supraclavicular, and axillary nodes normal  Neurologic:   CNII-XII intact, normal strength, sensation and reflexes    throughout          Assessment & Plan:

## 2015-03-19 LAB — HM PAP SMEAR

## 2015-04-15 ENCOUNTER — Encounter: Payer: Self-pay | Admitting: General Practice

## 2015-06-01 ENCOUNTER — Other Ambulatory Visit: Payer: Self-pay | Admitting: Family Medicine

## 2015-06-02 NOTE — Telephone Encounter (Signed)
Medication filled to pharmacy as requested.   

## 2015-07-30 ENCOUNTER — Telehealth: Payer: Self-pay | Admitting: Family Medicine

## 2015-07-30 NOTE — Telephone Encounter (Signed)
Informed patient that OTC Azo is used to relieve symptoms of an UTI, but does not actually treat the infection. Advised the patient to increase fluids, at least 8-10 glasses a day and keep appointment for tomorrow w/ Dr. Lorelei Pont. Patient understood and did not have any further questions or concerns prior to the end of call.

## 2015-07-30 NOTE — Telephone Encounter (Signed)
Relation to [id number] Call back number:[phone] [id number] [place], [place]. [phone] ([phone] (Fax)         Reason for call:  Patient scheduled an appointment with Dr. Lorelei Pont tomorrow 07/31/15 at [place] for a possible bladder infection and would like to know if the OTC azo is good. Please advise

## 2015-07-31 ENCOUNTER — Telehealth: Payer: Self-pay | Admitting: Family Medicine

## 2015-07-31 ENCOUNTER — Ambulatory Visit: Payer: 59 | Admitting: Family Medicine

## 2015-08-01 ENCOUNTER — Encounter: Payer: Self-pay | Admitting: Family Medicine

## 2015-08-01 NOTE — Telephone Encounter (Signed)
-----   Message from Darreld Mclean, MD sent at 08/01/2015  2:00 PM EDT ----- Charge please

## 2015-08-01 NOTE — Telephone Encounter (Signed)
Pt was no show for acute appt 07/31/15 9:00am, pt has not rescheduled, charge or no charge?

## 2015-08-14 ENCOUNTER — Encounter: Payer: Self-pay | Admitting: Medical

## 2015-08-14 ENCOUNTER — Other Ambulatory Visit (HOSPITAL_COMMUNITY)
Admission: RE | Admit: 2015-08-14 | Discharge: 2015-08-14 | Disposition: A | Discharge status: Home / Self Care | Payer: 59 | Source: Ambulatory Visit | Attending: Medical | Admitting: Medical

## 2015-08-14 ENCOUNTER — Ambulatory Visit (INDEPENDENT_AMBULATORY_CARE_PROVIDER_SITE_OTHER): Payer: 59 | Admitting: Medical

## 2015-08-14 VITALS — BP 120/80 | HR 58 | Temp 98.3°F | Ht 66.0 in | Wt 153.4 lb

## 2015-08-14 DIAGNOSIS — L298 Other pruritus: Secondary | ICD-10-CM | POA: Diagnosis not present

## 2015-08-14 DIAGNOSIS — R350 Frequency of micturition: Secondary | ICD-10-CM | POA: Diagnosis not present

## 2015-08-14 DIAGNOSIS — N390 Urinary tract infection, site not specified: Secondary | ICD-10-CM

## 2015-08-14 DIAGNOSIS — R300 Dysuria: Secondary | ICD-10-CM

## 2015-08-14 DIAGNOSIS — N898 Other specified noninflammatory disorders of vagina: Secondary | ICD-10-CM

## 2015-08-14 DIAGNOSIS — R82998 Other abnormal findings in urine: Secondary | ICD-10-CM

## 2015-08-14 DIAGNOSIS — N760 Acute vaginitis: Secondary | ICD-10-CM | POA: Diagnosis present

## 2015-08-14 LAB — POC URINALSYSI DIPSTICK (AUTOMATED)
Bilirubin, UA: NEGATIVE
Blood, UA: NEGATIVE
Glucose, UA: NEGATIVE
Ketones, UA: NEGATIVE
NITRITE UA: NEGATIVE
PH UA: 6
PROTEIN UA: NEGATIVE
Spec Grav, UA: >=1.03
Urobilinogen, UA: 0.2

## 2015-08-14 MED ORDER — CIPROFLOXACIN HCL 500 MG PO TABS: 500.0000 mg | ORAL_TABLET | Freq: Two times a day (BID) | ORAL | Status: DC

## 2015-08-14 MED ORDER — FLUCONAZOLE 150 MG PO TABS: 150.0000 mg | ORAL_TABLET | Freq: Once | ORAL | Status: DC

## 2015-08-14 NOTE — Progress Notes (Signed)
Subjective:    Patient ID: Abigail Johnson, female    DOB: 1960/07/27, 55 y.o.   MRN: [id number]  HPI  Pt in today reporting urinary symptoms for one week.  Dysuria- slight burn Frequent urination-slight Hesitancy-no Suprapubic pressure- Fever-no chills-no Nausea-no Vomiting-no CVA pain-no  History of UTI- rare/occasional uti. Gross hematuria-no  MiIld odor to urine and mild cloudy Slight itch. No reported vaginal discharge.  Pt had no recent antibiotics.  Review of Systems  Constitutional: Negative for fever, chills and fatigue.  Respiratory: Negative for cough, shortness of breath and wheezing.   Cardiovascular: Negative for chest pain and palpitations.  Gastrointestinal: Negative for abdominal pain.  Genitourinary: Positive for dysuria and frequency. Negative for urgency, difficulty urinating, vaginal pain and pelvic pain.  Musculoskeletal: Negative for back pain.  Skin: Negative for rash.  Hematological: Negative for adenopathy. Does not bruise/bleed easily.   Past Medical History  Diagnosis Date  . Hypertension   . History of chicken pox   . Heart murmur   . Blood transfusion complicating pregnancy   . UTI (urinary tract infection)     Social History   Social History  . Marital Status: Single    Spouse Name: N/A  . Number of Children: N/A  . Years of Education: N/A   Occupational History  . Not on file.   Social History Main Topics  . Smoking status: Never Smoker   . Smokeless tobacco: Not on file  . Alcohol Use: Yes  . Drug Use: No  . Sexual Activity: Yes   Other Topics Concern  . Not on file   Social History Narrative    Past Surgical History  Procedure Laterality Date  . Tonsillectomy    . Tonsillectomy and adenoidectomy      Family History  Problem Relation Age of Onset  . Hypertension Mother   . Diabetes Father   . Prostate cancer Paternal Grandfather     Allergies  Allergen Reactions  . Compazine [Prochlorperazine  Maleate] Other (See Comments)    Muscle tension    Current Outpatient Prescriptions on File Prior to Visit  Medication Sig Dispense Refill  . BENICAR HCT 40-12.5 MG tablet TAKE 1 TABLET BY MOUTH DAILY. 90 tablet 1  . Vitamin D, Ergocalciferol, (DRISDOL) 50000 UNITS CAPS capsule Take 1 capsule (50,000 Units total) by mouth every 7 (seven) days. 4 capsule 2   No current facility-administered medications on file prior to visit.    BP 120/80 mmHg  Pulse 58  Temp(Src) 98.3 F (36.8 C) (Oral)  Ht 5\' 6"  (1.676 m)  Wt 153 lb 6.4 oz (69.582 kg)  BMI 24.77 kg/m2  SpO2 98%  LMP 07/04/2015      Objective:   Physical Exam   General  Mental Status- Alert. Orientation- Orientation x 4.   Skin General:- Normal. Moisture- Dry. Temperature- Warm.  HEENT Head- normal.  Neck Neck- Supple.  Heart Ausculation-RRR  Lungs Ausculation- Clear, even, unlabored bilaterlly.    Abdomen Palpation/Percussion: Palpation and Percussion of the abdomen reveal- Non Tender, No Rebound tenderness, No Rigidity(guarding), No Palpable abdominal masses and No jar tenderness. No suprapubic tenderness. Liver:-Normal. Spleen:- Normal. Other Characteristics- No Costovertebral angle tenderness- Left or Costovertebral angle tenderness- Right.  Auscultation: Auscultation of the abdomen reveals- Bowel Sounds normal.     Assessment & Plan:  You appear to have a urinary tract infection. I am prescribing  cipro antibiotic for the probable infection. Hydrate well. I am sending out a urine culture and getting ancillary studies.  During the interim if your signs and symptoms worsen rather than improving please notify us. We will notify your when the culture results are back.  Follow up in 7 days or as needed.

## 2015-08-14 NOTE — Progress Notes (Signed)
Pre visit review using our clinic review tool, if applicable. No additional management support is needed unless otherwise documented below in the visit note. 

## 2015-08-14 NOTE — Patient Instructions (Addendum)
You appear to have a urinary tract infection. I am prescribing  cipro antibiotic for the probable infection. Hydrate well. I am sending out a urine culture and getting ancillary studies. During the interim if your signs and symptoms worsen rather than improving please notify us. We will notify your when the culture results are back.  Follow up in 7 days or as needed.  Making diflucan if with antibiotic you get yeast infection.  Short course antibiotic pending urine studies.

## 2015-08-16 LAB — URINE CULTURE: Colony Count: >=100000

## 2015-08-17 ENCOUNTER — Telehealth: Payer: Self-pay | Admitting: Medical

## 2015-08-17 MED ORDER — CIPROFLOXACIN HCL 500 MG PO TABS: 500.0000 mg | ORAL_TABLET | Freq: Two times a day (BID) | ORAL | Status: DC

## 2015-08-17 NOTE — Telephone Encounter (Signed)
rx 4 more days cipro sent in.

## 2015-08-18 LAB — URINE CYTOLOGY ANCILLARY ONLY: TRICH (WINDOWPATH): NEGATIVE

## 2015-08-18 NOTE — Progress Notes (Signed)
Quick Note:  Pt has seen results on MyChart and message also sent for patient to call back if any questions. ______ 

## 2015-08-19 NOTE — Progress Notes (Signed)
Quick Note:  Pt has seen results on MyChart and message also sent for patient to call back if any questions. ______ 

## 2015-08-21 LAB — URINE CYTOLOGY ANCILLARY ONLY
Bacterial vaginitis: POSITIVE — AB
Candida vaginitis: POSITIVE — AB

## 2015-08-24 MED ORDER — FLUCONAZOLE 150 MG PO TABS
ORAL_TABLET | ORAL | Status: DC
Start: 2015-08-24 — End: 2015-08-29

## 2015-08-24 MED ORDER — METRONIDAZOLE 500 MG PO TABS: 500.0000 mg | ORAL_TABLET | Freq: Two times a day (BID) | ORAL | Status: DC

## 2015-08-24 MED ORDER — FLUCONAZOLE 150 MG PO TABS: 150.0000 mg | ORAL_TABLET | Freq: Once | ORAL | Status: DC

## 2015-08-24 NOTE — Telephone Encounter (Signed)
Pt had bv on ancillary studies and yeast infection. So I will rx flagyl and diflucan.

## 2015-08-25 NOTE — Telephone Encounter (Signed)
Attempted to reach pt and VM is full. Will send a MyChart message to patient with the information from provider.

## 2015-08-29 ENCOUNTER — Ambulatory Visit (INDEPENDENT_AMBULATORY_CARE_PROVIDER_SITE_OTHER): Payer: 59 | Admitting: Family Medicine

## 2015-08-29 ENCOUNTER — Encounter: Payer: Self-pay | Admitting: Family Medicine

## 2015-08-29 VITALS — BP 118/80 | HR 82 | Temp 97.9°F | Resp 16 | Ht 66.0 in | Wt 151.5 lb

## 2015-08-29 DIAGNOSIS — N390 Urinary tract infection, site not specified: Secondary | ICD-10-CM

## 2015-08-29 DIAGNOSIS — N3000 Acute cystitis without hematuria: Secondary | ICD-10-CM | POA: Diagnosis not present

## 2015-08-29 DIAGNOSIS — I10 Essential (primary) hypertension: Secondary | ICD-10-CM

## 2015-08-29 LAB — CBC WITH DIFFERENTIAL/PLATELET
BASOS ABS: 0 10*3/uL (ref 0.0–0.1)
Basophils Relative: 0.6 % (ref 0.0–3.0)
EOS PCT: 4.4 % (ref 0.0–5.0)
Eosinophils Absolute: 0.2 10*3/uL (ref 0.0–0.7)
HEMATOCRIT: 36.2 % (ref 36.0–46.0)
Hemoglobin: 11.9 g/dL — ABNORMAL LOW (ref 12.0–15.0)
LYMPHS PCT: 39.6 % (ref 12.0–46.0)
Lymphs Abs: 1.7 10*3/uL (ref 0.7–4.0)
MCHC: 33 g/dL (ref 30.0–36.0)
MCV: 91.5 fl (ref 78.0–100.0)
MONOS PCT: 6.6 % (ref 3.0–12.0)
Monocytes Absolute: 0.3 10*3/uL (ref 0.1–1.0)
NEUTROS ABS: 2.1 10*3/uL (ref 1.4–7.7)
Neutrophils Relative %: 48.8 % (ref 43.0–77.0)
PLATELETS: 217 10*3/uL (ref 150.0–400.0)
RBC: 3.95 Mil/uL (ref 3.87–5.11)
RDW: 15.3 % (ref 11.5–15.5)
WBC: 4.3 10*3/uL (ref 4.0–10.5)

## 2015-08-29 LAB — POCT URINALYSIS DIPSTICK
Bilirubin, UA: NEGATIVE
GLUCOSE UA: NEGATIVE
Ketones, UA: NEGATIVE
LEUKOCYTES UA: NEGATIVE
NITRITE UA: NEGATIVE
PROTEIN UA: NEGATIVE
SPEC GRAV UA: 1.015
UROBILINOGEN UA: 0.2
pH, UA: 6.5

## 2015-08-29 LAB — BASIC METABOLIC PANEL
BUN: 16 mg/dL (ref 6–23)
CALCIUM: 9.7 mg/dL (ref 8.4–10.5)
CHLORIDE: 100 meq/L (ref 96–112)
CO2: 28 meq/L (ref 19–32)
Creatinine, Ser: 1.02 mg/dL (ref 0.40–1.20)
GFR: 73.03 mL/min (ref >60.00)
Glucose, Bld: 74 mg/dL (ref 70–99)
POTASSIUM: 3.9 meq/L (ref 3.5–5.1)
SODIUM: 136 meq/L (ref 135–145)

## 2015-08-29 NOTE — Patient Instructions (Signed)
Schedule your complete physical in 6 months We'll notify you of your lab results and make any changes if needed Pick up the Flagyl and Diflucan at the pharmacy and take as directed Call with any questions or concerns Keep up the good work!  You look great!! Thanks for sticking with [place]!!! Happy Spring!!!

## 2015-08-29 NOTE — Assessment & Plan Note (Signed)
Pt reports discomfort is better but we will send urine for culture for TOC.  She is to pick up the diflucan and flagyl at the pharmacy for the BV and yeast.  Pt expressed understanding and is in agreement w/ plan.

## 2015-08-29 NOTE — Assessment & Plan Note (Signed)
Chronic problem.  Well controlled.  Asymptomatic.  Applauded her efforts at exercise.  Check labs.  No anticipated med changes.

## 2015-08-29 NOTE — Progress Notes (Signed)
Pre visit review using our clinic review tool, if applicable. No additional management support is needed unless otherwise documented below in the visit note. 

## 2015-08-29 NOTE — Progress Notes (Signed)
   Subjective:    Patient ID: Abigail Johnson, female    DOB: [birth year], 55 y.o.   MRN: [id number]  HPI HTN- chronic problem, on Benicar HCT w/ good control today.  Exercising regularly.  Denies CP, SOB, HAs, visual changes, edema.  Urine discomfort- pt reports the burning is better but continues to have itching.  Urine from 4/6 shows yeast and BV and pt has script for both diflucan and flagyl at pharmacy.   Review of Systems For ROS see HPI     Objective:   Physical Exam  Constitutional: She is oriented to person, place, and time. She appears well-developed and well-nourished. No distress.  HENT:  Head: Normocephalic and atraumatic.  Eyes: Conjunctivae and EOM are normal. Pupils are equal, round, and reactive to light.  Neck: Normal range of motion. Neck supple. No thyromegaly present.  Cardiovascular: Normal rate, regular rhythm, normal heart sounds and intact distal pulses.   No murmur heard. Pulmonary/Chest: Effort normal and breath sounds normal. No respiratory distress.  Abdominal: Soft. She exhibits no distension. There is no tenderness.  Musculoskeletal: She exhibits no edema.  Lymphadenopathy:    She has no cervical adenopathy.  Neurological: She is alert and oriented to person, place, and time.  Skin: Skin is warm and dry.  Psychiatric: She has a normal mood and affect. Her behavior is normal.  Vitals reviewed.         Assessment & Plan:

## 2015-08-31 LAB — URINE CULTURE
COLONY COUNT: NO GROWTH
ORGANISM ID, BACTERIA: NO GROWTH

## 2015-09-10 ENCOUNTER — Telehealth: Payer: Self-pay | Admitting: Family Medicine

## 2015-09-10 MED ORDER — OLMESARTAN MEDOXOMIL-HCTZ 40-12.5 MG PO TABS: 1.0000 | ORAL_TABLET | Freq: Every day | ORAL | Status: DC

## 2015-09-10 NOTE — Telephone Encounter (Signed)
Medication filled to pharmacy as requested.   

## 2015-09-10 NOTE — Telephone Encounter (Signed)
[facility] [address]

## 2016-03-02 ENCOUNTER — Encounter: Payer: 59 | Admitting: Family Medicine

## 2016-03-25 ENCOUNTER — Telehealth: Payer: Self-pay | Admitting: Family Medicine

## 2016-03-25 NOTE — Telephone Encounter (Signed)
Ok to switch 

## 2016-03-25 NOTE — Telephone Encounter (Signed)
ok 

## 2016-03-25 NOTE — Telephone Encounter (Signed)
Pt would like to switch from Dr. Birdie Riddle to Dr Lorelei Pont. Pt says that she is closer to the [facility] office.     Is switch okay?

## 2016-03-26 ENCOUNTER — Ambulatory Visit (INDEPENDENT_AMBULATORY_CARE_PROVIDER_SITE_OTHER): Payer: 59 | Admitting: Medical

## 2016-03-26 ENCOUNTER — Other Ambulatory Visit (HOSPITAL_COMMUNITY)
Admission: RE | Admit: 2016-03-26 | Discharge: 2016-03-26 | Disposition: A | Discharge status: Home / Self Care | Payer: 59 | Source: Ambulatory Visit | Attending: Medical | Admitting: Medical

## 2016-03-26 ENCOUNTER — Encounter: Payer: Self-pay | Admitting: Medical

## 2016-03-26 VITALS — BP 100/68 | HR 77 | Temp 98.1°F | Ht 66.0 in | Wt 152.0 lb

## 2016-03-26 DIAGNOSIS — N898 Other specified noninflammatory disorders of vagina: Secondary | ICD-10-CM

## 2016-03-26 DIAGNOSIS — B379 Candidiasis, unspecified: Secondary | ICD-10-CM

## 2016-03-26 DIAGNOSIS — Z113 Encounter for screening for infections with a predominantly sexual mode of transmission: Secondary | ICD-10-CM | POA: Insufficient documentation

## 2016-03-26 LAB — POCT URINALYSIS DIPSTICK
Bilirubin, UA: NEGATIVE
Glucose, UA: NEGATIVE
Ketones, UA: NEGATIVE
Leukocytes, UA: NEGATIVE
Nitrite, UA: NEGATIVE
PROTEIN UA: NEGATIVE
RBC UA: NEGATIVE
SPEC GRAV UA: 1.02
UROBILINOGEN UA: 4
pH, UA: 6.5

## 2016-03-26 MED ORDER — FLUCONAZOLE 150 MG PO TABS: 150.0000 mg | ORAL_TABLET | Freq: Once | ORAL | 0 refills | Status: AC

## 2016-03-26 NOTE — Progress Notes (Signed)
Subjective:    Patient ID: Abigail Johnson, female    DOB: 1960-06-15, 55 y.o.   MRN: [id number]  HPI   Pt in for some thick white discharge. Some recent itching. Symptoms for one week. Monistat helped just a little then symptoms came back the same. No recent antibiotic. Some odor to dc.   No fever, no chills or sweats. No back pain.   No frequent urination.   Review of Systems  Constitutional: Negative for chills, fatigue and fever.  Respiratory: Negative for cough, chest tightness, shortness of breath and wheezing.   Cardiovascular: Negative for chest pain and palpitations.  Gastrointestinal: Negative for abdominal distention, abdominal pain, blood in stool, constipation, rectal pain and vomiting.  Genitourinary: Positive for vaginal discharge. Negative for difficulty urinating, dysuria, flank pain, genital sores, menstrual problem and urgency.       Vaginal itch white dc.  Musculoskeletal: Negative for back pain.  Skin: Negative for rash.  Hematological: Negative for adenopathy. Does not bruise/bleed easily.    Past Medical History:  Diagnosis Date  . Blood transfusion complicating pregnancy   . Heart murmur   . History of chicken pox   . Hypertension   . UTI (urinary tract infection)      Social History   Social History  . Marital status: Single    Spouse name: N/A  . Number of children: N/A  . Years of education: N/A   Occupational History  . Not on file.   Social History Main Topics  . Smoking status: Never Smoker  . Smokeless tobacco: Not on file  . Alcohol use Yes  . Drug use: No  . Sexual activity: Yes   Other Topics Concern  . Not on file   Social History Narrative  . No narrative on file    Past Surgical History:  Procedure Laterality Date  . TONSILLECTOMY    . TONSILLECTOMY AND ADENOIDECTOMY      Family History  Problem Relation Age of Onset  . Hypertension Mother   . Diabetes Father   . Prostate cancer Paternal Grandfather      Allergies  Allergen Reactions  . Compazine [Prochlorperazine Maleate] Other (See Comments)    Muscle tension    Current Outpatient Prescriptions on File Prior to Visit  Medication Sig Dispense Refill  . olmesartan-hydrochlorothiazide (BENICAR HCT) 40-12.5 MG tablet Take 1 tablet by mouth daily. 90 tablet 1   No current facility-administered medications on file prior to visit.     BP 100/68 (BP Location: Left Arm, Patient Position: Sitting, Cuff Size: Normal)   Pulse 77   Temp 98.1 F (36.7 C) (Oral)   Ht 5\' 6"  (1.676 m)   Wt 152 lb (68.9 kg)   LMP 03/11/2016   SpO2 98%   BMI 24.53 kg/m       Objective:   Physical Exam  General Appearance- Not in acute distress.  HEENT Eyes- Scleraeral/Conjuntiva-bilat- Not Yellow. Mouth & Throat- Normal.  Chest and Lung Exam Auscultation: Breath sounds:-Normal. Adventitious sounds:- No Adventitious sounds.  Cardiovascular Auscultation:Rythm - Regular. Heart Sounds -Normal heart sounds.  Abdomen Inspection:-Inspection Normal.  Palpation/Perucssion: Palpation and Percussion of the abdomen reveal- Non Tender(no suprapubic tenderness), No Rebound tenderness, No rigidity(Guarding) and No Palpable abdominal masses.  Liver:-Normal.  Spleen:- Normal.   Back- no cva tenderness.       Assessment & Plan:  For probable yeast infection will rx diflucan tablet. Will do urine ancillary and culture testing to see if any other potential cause including  BV.  Follow up 5 days or as needed

## 2016-03-26 NOTE — Patient Instructions (Addendum)
For probable yeast infection will rx diflucan tablet. Will do urine ancillary and culture testing to see if any other potential cause including BV.  Follow up 5 days or as needed

## 2016-03-26 NOTE — Progress Notes (Signed)
Pre visit review using our clinic review tool, if applicable. No additional management support is needed unless otherwise documented below in the visit note. 

## 2016-03-27 LAB — URINE CULTURE

## 2016-03-29 LAB — URINE CYTOLOGY ANCILLARY ONLY
CHLAMYDIA, DNA PROBE: NEGATIVE
Neisseria Gonorrhea: NEGATIVE
Trichomonas: NEGATIVE

## 2016-03-29 NOTE — Telephone Encounter (Signed)
I wanted gardnerella and candidiasis done. Is that study pending?

## 2016-03-30 ENCOUNTER — Telehealth: Payer: Self-pay | Admitting: Medical

## 2016-03-30 NOTE — Telephone Encounter (Signed)
Called pt to schedule her an appt to transition care from [place] to [place]. No answer. vm is to full, unable to lvm.

## 2016-03-30 NOTE — Telephone Encounter (Signed)
Will you see if bv and candida was ordered. It appears it was not ordered.

## 2016-03-30 NOTE — Telephone Encounter (Signed)
If these tests are ordered they are sent out to another lab after all the other tests are resulted so it takes a longer turn around time for Gardnerella & Candidiasis to come back.(about 5 or 6 days) Peter Congo at [employer] has confirmed this before.

## 2016-03-30 NOTE — Telephone Encounter (Signed)
Reviewed to see if candida and bv ordered. Will send message to lab to recheck.

## 2016-03-31 ENCOUNTER — Telehealth: Payer: Self-pay | Admitting: Medical

## 2016-03-31 NOTE — Telephone Encounter (Signed)
Pt has some vaginal itching despite 2 diflucan. Her test were negative but appears bv and candida were not done. Not suture why since I thought I ordered. Will you call pt on monday and have her come in She could just give sample and not be seen.

## 2016-03-31 NOTE — Telephone Encounter (Signed)
Caller name: Denia  Relation to pt: self Call back number: [phone] Pharmacy:  Reason for call: Pt called stating that PCP saw her on 03-26-16 (for vaginal odor), and that PCP wanted her to call the office to give an update with him or nurse about her issue that she came in on Friday (03-26-16). Please advise ASAP.

## 2016-03-31 NOTE — Telephone Encounter (Signed)
Attempted to reach the patient and the message states "the mailbox is full and is not able to take messages at this time.

## 2016-03-31 NOTE — Telephone Encounter (Signed)
Spoke with pt. She states she took both diflucan tablets and continues to have some vaginal itching. If further [facility] [address].  Please advise?

## 2016-04-05 ENCOUNTER — Telehealth: Payer: Self-pay | Admitting: Medical

## 2016-04-05 LAB — URINE CYTOLOGY ANCILLARY ONLY
BACTERIAL VAGINITIS: POSITIVE — AB
CANDIDA VAGINITIS: NEGATIVE

## 2016-04-05 MED ORDER — METRONIDAZOLE 500 MG PO TABS: 500.0000 mg | ORAL_TABLET | Freq: Two times a day (BID) | ORAL | 0 refills | Status: DC

## 2016-04-05 NOTE — Telephone Encounter (Signed)
rx flagyl sent to her pharmacy.

## 2016-04-05 NOTE — Telephone Encounter (Signed)
I spoke with Abigail Johnson at [facility] and the test's are in progress. Please note that these tests take longer than the other tests.(about 5 days after you get the first results) Once the first set are resulted the [facility] get sent out for testing. Because of the Holiday and weekend results took even longer. This patient should not come in to provide another specimen.

## 2016-04-05 NOTE — Telephone Encounter (Signed)
Results for Bv & Candida are now in Epic. KMP

## 2016-04-05 NOTE — Telephone Encounter (Signed)
Noted  

## 2016-04-06 NOTE — Progress Notes (Signed)
Pt has seen results on MyChart and message also sent for patient to call back if any questions.

## 2016-09-15 DIAGNOSIS — N390 Urinary tract infection, site not specified: Secondary | ICD-10-CM | POA: Diagnosis not present

## 2016-09-15 DIAGNOSIS — N760 Acute vaginitis: Secondary | ICD-10-CM | POA: Diagnosis not present

## 2016-09-15 DIAGNOSIS — K649 Unspecified hemorrhoids: Secondary | ICD-10-CM | POA: Diagnosis not present

## 2016-09-22 ENCOUNTER — Other Ambulatory Visit: Payer: Self-pay | Admitting: Family Medicine

## 2016-10-21 ENCOUNTER — Other Ambulatory Visit: Payer: Self-pay | Admitting: Emergency Medicine

## 2016-10-21 MED ORDER — OLMESARTAN MEDOXOMIL-HCTZ 40-12.5 MG PO TABS: 1.0000 | ORAL_TABLET | Freq: Every day | ORAL | 0 refills | Status: DC

## 2016-10-21 NOTE — Telephone Encounter (Signed)
Patient calling for refills of Benicar-HCT until appointment with PCP. Scheduled appointment for follow up on BP. Refilled medication for 30 day supply.

## 2016-10-27 ENCOUNTER — Ambulatory Visit: Payer: 59 | Admitting: Family Medicine

## 2016-11-04 ENCOUNTER — Ambulatory Visit: Payer: 59 | Admitting: Family Medicine

## 2016-11-04 DIAGNOSIS — Z0289 Encounter for other administrative examinations: Secondary | ICD-10-CM

## 2016-11-19 ENCOUNTER — Other Ambulatory Visit: Payer: Self-pay | Admitting: Family Medicine

## 2016-12-01 ENCOUNTER — Other Ambulatory Visit: Payer: Self-pay | Admitting: Family Medicine

## 2017-01-20 ENCOUNTER — Telehealth: Payer: Self-pay | Admitting: Family Medicine

## 2017-01-20 NOTE — Telephone Encounter (Signed)
Pt says that she established care with P[facility], not showing. Pt is requesting a refill request for medication olmesartan-hydrochlorothiazide     [id number] [place], [place]

## 2017-01-21 ENCOUNTER — Other Ambulatory Visit: Payer: Self-pay

## 2017-01-21 MED ORDER — OLMESARTAN MEDOXOMIL-HCTZ 40-12.5 MG PO TABS: 1.0000 | ORAL_TABLET | Freq: Every day | ORAL | 0 refills | Status: DC

## 2017-01-24 NOTE — Telephone Encounter (Signed)
I saw that refilled her blood pressure medication for 30 days. Please have her come in for follow-up before she runs out. Asked her to schedule convenient appointment early in the morning or early in afternoon to minimize her wait time.

## 2017-01-25 NOTE — Telephone Encounter (Signed)
Pt refill sent and follow up scheduled

## 2017-02-10 ENCOUNTER — Ambulatory Visit (HOSPITAL_BASED_OUTPATIENT_CLINIC_OR_DEPARTMENT_OTHER)
Admission: RE | Admit: 2017-02-10 | Discharge: 2017-02-10 | Disposition: A | Discharge status: Home / Self Care | Payer: 59 | Source: Ambulatory Visit | Attending: Medical | Admitting: Medical

## 2017-02-10 ENCOUNTER — Ambulatory Visit (INDEPENDENT_AMBULATORY_CARE_PROVIDER_SITE_OTHER): Payer: 59 | Admitting: Medical

## 2017-02-10 ENCOUNTER — Encounter: Payer: Self-pay | Admitting: Medical

## 2017-02-10 VITALS — BP 149/95 | HR 66 | Temp 98.1°F | Resp 16 | Ht 66.0 in | Wt 154.4 lb

## 2017-02-10 DIAGNOSIS — Z1231 Encounter for screening mammogram for malignant neoplasm of breast: Secondary | ICD-10-CM

## 2017-02-10 DIAGNOSIS — Z23 Encounter for immunization: Secondary | ICD-10-CM | POA: Diagnosis not present

## 2017-02-10 DIAGNOSIS — Z1239 Encounter for other screening for malignant neoplasm of breast: Secondary | ICD-10-CM

## 2017-02-10 DIAGNOSIS — I10 Essential (primary) hypertension: Secondary | ICD-10-CM | POA: Diagnosis not present

## 2017-02-10 DIAGNOSIS — Z1211 Encounter for screening for malignant neoplasm of colon: Secondary | ICD-10-CM | POA: Diagnosis not present

## 2017-02-10 LAB — COMPREHENSIVE METABOLIC PANEL
ALBUMIN: 3.9 g/dL (ref 3.5–5.2)
ALT: 15 U/L (ref 0–35)
AST: 18 U/L (ref 0–37)
Alkaline Phosphatase: 48 U/L (ref 39–117)
BILIRUBIN TOTAL: 0.4 mg/dL (ref 0.2–1.2)
BUN: 10 mg/dL (ref 6–23)
CALCIUM: 9.8 mg/dL (ref 8.4–10.5)
CO2: 30 meq/L (ref 19–32)
Chloride: 99 mEq/L (ref 96–112)
Creatinine, Ser: 0.99 mg/dL (ref 0.40–1.20)
GFR: 75.17 mL/min (ref >60.00)
GLUCOSE: 92 mg/dL (ref 70–99)
Potassium: 4.1 mEq/L (ref 3.5–5.1)
Sodium: 135 mEq/L (ref 135–145)
TOTAL PROTEIN: 7.3 g/dL (ref 6.0–8.3)

## 2017-02-10 LAB — LIPID PANEL
CHOL/HDL RATIO: 2
Cholesterol: 143 mg/dL (ref 0–200)
HDL: 77.3 mg/dL (ref >39.00)
LDL Cholesterol: 55 mg/dL (ref 0–99)
NONHDL: 66.02
Triglycerides: 56 mg/dL (ref 0.0–149.0)
VLDL: 11.2 mg/dL (ref 0.0–40.0)

## 2017-02-10 MED ORDER — OLMESARTAN MEDOXOMIL-HCTZ 40-12.5 MG PO TABS: 1.0000 | ORAL_TABLET | Freq: Every day | ORAL | 1 refills | Status: DC

## 2017-02-10 NOTE — Progress Notes (Signed)
Subjective:    Patient ID: Abigail Johnson, female    DOB: [birth year], 56 y.o.   MRN: [id number]  HPI   Pt in for bp check. No cardiac or neurologic signs or symptoms.   Pt willing to get flu vaccine today and will get tdap later.  Pt is due for colonoscopy.  She is due for mammogram.    Review of Systems  Constitutional: Negative for chills, fatigue and fever.  Respiratory: Negative for cough, chest tightness, shortness of breath and wheezing.   Cardiovascular: Negative for chest pain and palpitations.  Gastrointestinal: Negative for abdominal pain, diarrhea, nausea and vomiting.  Musculoskeletal: Negative for back pain.  Skin: Negative for rash.  Neurological: Negative for dizziness, numbness and headaches.  Hematological: Negative for adenopathy.  Psychiatric/Behavioral: Negative for behavioral problems, confusion and dysphoric mood. The patient is not nervous/anxious.     Past Medical History:  Diagnosis Date  . Blood transfusion complicating pregnancy   . Heart murmur   . History of chicken pox   . Hypertension   . UTI (urinary tract infection)      Social History   Social History  . Marital status: Single    Spouse name: N/A  . Number of children: N/A  . Years of education: N/A   Occupational History  . Not on file.   Social History Main Topics  . Smoking status: Never Smoker  . Smokeless tobacco: Never Used  . Alcohol use Yes  . Drug use: No  . Sexual activity: Yes   Other Topics Concern  . Not on file   Social History Narrative  . No narrative on file    Past Surgical History:  Procedure Laterality Date  . TONSILLECTOMY    . TONSILLECTOMY AND ADENOIDECTOMY      Family History  Problem Relation Age of Onset  . Hypertension Mother   . Diabetes Father   . Prostate cancer Paternal Grandfather     Allergies  Allergen Reactions  . Compazine [Prochlorperazine Maleate] Other (See Comments)    Muscle tension    Current Outpatient  Prescriptions on File Prior to Visit  Medication Sig Dispense Refill  . olmesartan-hydrochlorothiazide (BENICAR HCT) 40-12.5 MG tablet Take 1 tablet by mouth daily. 30 tablet 0   No current facility-administered medications on file prior to visit.     BP (!) 149/95   Pulse 66   Temp 98.1 F (36.7 C) (Oral)   Resp 16   Ht 5\' 6"  (1.676 m)   Wt 154 lb 6.4 oz (70 kg)   SpO2 100%   BMI 24.92 kg/m       Objective:   Physical Exam  General Mental Status- Alert. General Appearance- Not in acute distress.   Skin General: Color- Normal Color. Moisture- Normal Moisture.  Neck Carotid Arteries- Normal color. Moisture- Normal Moisture. No carotid bruits. No JVD.  Chest and Lung Exam Auscultation: Breath Sounds:-Normal.  Cardiovascular Auscultation:Rythm- Regular. Murmurs & Other Heart Sounds:Auscultation of the heart reveals- No Murmurs.    Neurologic Cranial Nerve exam:- CN III-XII intact(No nystagmus), symmetric smile. Finger to Nose:- Normal/Intact Strength:- 5/5 equal and symmetric strength both upper and lower extremities.      Assessment & Plan:  For your hypertension I am refilling her Benicar today. Your blood pressure is little high today. I want you to check every other day at work for a week  and also check your blood pressure at home. Write your readings down and my chart me in one  week. If your blood pressure readings are consistently over 140/90 then will add new blood pressure medication to your regimen.   Please get metabolic panel and lipid panel today.  I put in screening mammogram order today and you can get that scheduled downstairs.  Also put in screening colonoscopy/GI referral. If you don't get a call from themby 2 weeks then advise call here and I speak to [facility]  for update on the referral.  Follow-up date to be determined after reviewing your blood pressure readings on my chart.  Angelo Prindle, [facility], PA-C

## 2017-02-10 NOTE — Patient Instructions (Addendum)
For your hypertension I am refilling her Benicar today. Your blood pressure is little high today. I want you to check every other day at work for a week  and also check your blood pressure at home. Write your readings down and my chart me in one week. If your blood pressure readings are consistently over 140/90 then will add new blood pressure medication to your regimen.   Please get metabolic panel and lipid panel today.  I put in screening mammogram order today and you can get that scheduled downstairs.  Also put in screening colonoscopy/GI referral. If you don't get a call from themby 2 weeks then advise call here and I speak to [facility]  for update on the referral.  Follow-up date to be determined after reviewing your blood pressure readings on my chart.

## 2017-02-14 ENCOUNTER — Other Ambulatory Visit: Payer: Self-pay | Admitting: Medical

## 2017-02-14 DIAGNOSIS — R928 Other abnormal and inconclusive findings on diagnostic imaging of breast: Secondary | ICD-10-CM

## 2017-02-15 ENCOUNTER — Telehealth: Payer: Self-pay | Admitting: *Deleted

## 2017-02-15 NOTE — Telephone Encounter (Signed)
Received Physician Orders from [facility]; forwarded to ordering provider/[facility] 10/09

## 2017-02-18 ENCOUNTER — Ambulatory Visit: Payer: 59

## 2017-02-18 ENCOUNTER — Ambulatory Visit
Admission: RE | Admit: 2017-02-18 | Discharge: 2017-02-18 | Disposition: A | Discharge status: Home / Self Care | Payer: 59 | Source: Ambulatory Visit | Attending: Medical | Admitting: Medical

## 2017-02-18 DIAGNOSIS — R922 Inconclusive mammogram: Secondary | ICD-10-CM | POA: Diagnosis not present

## 2017-02-18 DIAGNOSIS — R928 Other abnormal and inconclusive findings on diagnostic imaging of breast: Secondary | ICD-10-CM

## 2017-04-13 ENCOUNTER — Encounter: Payer: Self-pay | Admitting: Medical

## 2017-04-18 ENCOUNTER — Telehealth: Payer: Self-pay | Admitting: Medical

## 2017-04-18 NOTE — Telephone Encounter (Signed)
Would you send a letter to patient stating that GI office has informed us they have been trying to attempt to schedule her colonoscopy.  Please let patient know she could call our office to speak with [place] so we can reattempt referral if she will get that study done.

## 2017-04-18 NOTE — Telephone Encounter (Signed)
Open to review.  

## 2017-04-21 NOTE — Telephone Encounter (Signed)
Mailed letter °

## 2017-07-12 ENCOUNTER — Ambulatory Visit: Payer: 59 | Admitting: Medical

## 2017-07-12 ENCOUNTER — Encounter: Payer: Self-pay | Admitting: Medical

## 2017-07-12 VITALS — BP 126/99 | HR 69 | Temp 97.8°F | Resp 16 | Ht 66.0 in | Wt 151.6 lb

## 2017-07-12 DIAGNOSIS — R110 Nausea: Secondary | ICD-10-CM

## 2017-07-12 DIAGNOSIS — M791 Myalgia, unspecified site: Secondary | ICD-10-CM

## 2017-07-12 DIAGNOSIS — R197 Diarrhea, unspecified: Secondary | ICD-10-CM | POA: Diagnosis not present

## 2017-07-12 DIAGNOSIS — R5383 Other fatigue: Secondary | ICD-10-CM

## 2017-07-12 MED ORDER — ONDANSETRON 8 MG PO TBDP: 8.0000 mg | ORAL_TABLET | Freq: Three times a day (TID) | ORAL | 0 refills | Status: DC | PRN

## 2017-07-12 NOTE — Progress Notes (Signed)
Subjective:    Patient ID: Forest Becker, female    DOB: 1961/04/11, 57 y.o.   MRN: [id number]  HPI   Pt on Sunday early am had loose stools, vomiting,  diarrhea, body aches, head ache and fatigue.(Also on first day ran fevers)  Now more than 48 hours since onset of illness  Pt states 3-4 loose days a day. Not real watery. Today no bm. Last time she vomited was on Sunday.   Pt does not report any suspicious foods before illness. Pt grandson was sick day before she got sick. The same day she got sick daughter got sick as well.   Pt thinks grandson may have had type b flu.   Pt has been hydrating with ginger ale and water.  Review of Systems  Constitutional: Positive for fatigue. Negative for chills and fever.  Respiratory: Negative for cough, choking, shortness of breath and wheezing.   Cardiovascular: Negative for chest pain and palpitations.  Gastrointestinal: Positive for diarrhea, nausea and vomiting. Negative for abdominal pain and blood in stool.  Genitourinary: Negative for dyspareunia, enuresis and flank pain.  Musculoskeletal: Positive for myalgias. Negative for back pain and neck pain.       Bodyaches much less since yesterday afternoon. No longer needs motrin  Skin: Negative for rash.  Neurological: Negative for dizziness, seizures, speech difficulty, weakness and headaches.  Hematological: Negative for adenopathy. Does not bruise/bleed easily.  Psychiatric/Behavioral: Negative for behavioral problems, confusion, decreased concentration and suicidal ideas.    Past Medical History:  Diagnosis Date  . Blood transfusion complicating pregnancy   . Heart murmur   . History of chicken pox   . Hypertension   . UTI (urinary tract infection)      Social History   Socioeconomic History  . Marital status: Single    Spouse name: Not on file  . Number of children: Not on file  . Years of education: Not on file  . Highest education level: Not on file  Social Needs  .  Financial resource strain: Not on file  . Food insecurity - worry: Not on file  . Food insecurity - inability: Not on file  . Transportation needs - medical: Not on file  . Transportation needs - non-medical: Not on file  Occupational History  . Not on file  Tobacco Use  . Smoking status: Never Smoker  . Smokeless tobacco: Never Used  Substance and Sexual Activity  . Alcohol use: Yes  . Drug use: No  . Sexual activity: Yes  Other Topics Concern  . Not on file  Social History Narrative  . Not on file    Past Surgical History:  Procedure Laterality Date  . TONSILLECTOMY    . TONSILLECTOMY AND ADENOIDECTOMY      Family History  Problem Relation Age of Onset  . Hypertension Mother   . Diabetes Father   . Prostate cancer Paternal Grandfather     Allergies  Allergen Reactions  . Compazine [Prochlorperazine Maleate] Other (See Comments)    Muscle tension    Current Outpatient Medications on File Prior to Visit  Medication Sig Dispense Refill  . olmesartan-hydrochlorothiazide (BENICAR HCT) 40-12.5 MG tablet Take 1 tablet by mouth daily. 90 tablet 1   No current facility-administered medications on file prior to visit.     BP (!) 126/99   Pulse 69   Temp 97.8 F (36.6 C) (Oral)   Resp 16   Ht _0  (1.676 m)   Wt 151 lb 9.6  oz (68.8 kg)   SpO2 100%   BMI 24.47 kg/m       Objective:   Physical Exam  General Appearance- Not in acute distress.  HEENT Eyes- Scleraeral/Conjuntiva-bilat- Not Yellow. Mouth & Throat- Normal.  Chest and Lung Exam Auscultation: Breath sounds:-Normal. Adventitious sounds:- No Adventitious sounds.  Cardiovascular Auscultation:Rythm - Regular. Heart Sounds -Normal heart sounds.  Abdomen Inspection:-Inspection Normal.  Palpation/Perucssion: Palpation and Percussion of the abdomen reveal- Non Tender, No Rebound tenderness, No rigidity(Guarding) and No Palpable abdominal masses.  Liver:-Normal.  Spleen:- Normal.   Back- no cva  tenderness.      Assessment & Plan:  If you have recurrent diarrhea more than 3 times a day let us now and would recommend getting stool panel kit. Can use immodium AD as well if you get recurrent diarrhea.. Presently I think studies not needed as you are getting better and likely had viral GI illness.  For nausea or vomiting, I am prescribing zofran.  I don't think labs necessary today as you have been improving. If you feel worse again then let me know and would get labs.  Eat bland diet over next 5-7 days. Also can hydrate with propel fitness water.  Rapid flu test was negative. But still think you had viral gastroenteritis.  Follow up in 7-10 days or as needed  [employer], [employer]

## 2017-07-12 NOTE — Patient Instructions (Addendum)
If you have recurrent diarrhea more than 3 times a day let us now and would recommend getting stool panel kit. Can use immodium AD as well if you get recurrent diarrhea. Presently I think studies not needed as you are getting better and likely had viral GI illness.  For nausea or vomiting, I am prescribing zofran.  I don't think labs necessary today as you have been improving. If you feel worse again then let me know and would get labs.  Eat bland diet over next 5-7 days. Also can hydrate with propel fitness water.  Rapid flu test was negative. But still think you had viral gastroenteritis.  Follow up in 7-10 days or as needed

## 2017-08-08 ENCOUNTER — Other Ambulatory Visit: Payer: Self-pay | Admitting: Medical

## 2017-10-02 ENCOUNTER — Other Ambulatory Visit: Payer: Self-pay | Admitting: Medical

## 2017-12-31 ENCOUNTER — Other Ambulatory Visit: Payer: Self-pay | Admitting: Medical

## 2018-07-17 ENCOUNTER — Other Ambulatory Visit: Payer: Self-pay | Admitting: Medical

## 2018-07-18 NOTE — Telephone Encounter (Signed)
Spoke with pt and schedule an appt on 07-20-2018. Done

## 2018-07-18 NOTE — Telephone Encounter (Signed)
Pt due for follow up please call and schedule appointment.  

## 2018-07-20 ENCOUNTER — Ambulatory Visit: Payer: 59 | Admitting: Medical

## 2018-07-20 ENCOUNTER — Encounter: Payer: Self-pay | Admitting: Medical

## 2018-07-20 ENCOUNTER — Other Ambulatory Visit: Payer: Self-pay

## 2018-07-20 VITALS — BP 133/80 | HR 66 | Temp 98.1°F | Wt 160.8 lb

## 2018-07-20 DIAGNOSIS — I10 Essential (primary) hypertension: Secondary | ICD-10-CM | POA: Diagnosis not present

## 2018-07-20 MED ORDER — OLMESARTAN MEDOXOMIL-HCTZ 40-12.5 MG PO TABS: 1.0000 | ORAL_TABLET | Freq: Every day | ORAL | 2 refills | Status: DC

## 2018-07-20 NOTE — Progress Notes (Signed)
Subjective:    Patient ID: Abigail Johnson, female    DOB: 1961/03/11, 58 y.o.   MRN: [id number]  HPI  Pt her for bp check. Her bp at home have been 120/80 range consistently as she check bp daily. N cardiac or neurologic signs or symptoms. Pt is compliant on meds.   Pt not fasting today.    Review of Systems  Constitutional: Negative for activity change, chills, diaphoresis, fatigue and fever.  Respiratory: Negative for cough, chest tightness and shortness of breath.   Cardiovascular: Negative for chest pain, palpitations and leg swelling.  Gastrointestinal: Negative for abdominal pain, nausea and vomiting.  Musculoskeletal: Negative for neck pain and neck stiffness.  Skin: Negative for rash.  Neurological: Negative for dizziness, seizures, syncope, facial asymmetry and weakness.  Hematological: Negative for adenopathy. Does not bruise/bleed easily.  Psychiatric/Behavioral: Negative for agitation, behavioral problems and confusion. The patient is not nervous/anxious.    Past Medical History:  Diagnosis Date  . Blood transfusion complicating pregnancy   . Heart murmur   . History of chicken pox   . Hypertension   . UTI (urinary tract infection)      Social History   Socioeconomic History  . Marital status: Single    Spouse name: Not on file  . Number of children: Not on file  . Years of education: Not on file  . Highest education level: Not on file  Occupational History  . Not on file  Social Needs  . Financial resource strain: Not on file  . Food insecurity:    Worry: Not on file    Inability: Not on file  . Transportation needs:    Medical: Not on file    Non-medical: Not on file  Tobacco Use  . Smoking status: Never Smoker  . Smokeless tobacco: Never Used  Substance and Sexual Activity  . Alcohol use: Yes  . Drug use: No  . Sexual activity: Yes  Lifestyle  . Physical activity:    Days per week: Not on file    Minutes per session: Not on file  . Stress:  Not on file  Relationships  . Social connections:    Talks on phone: Not on file    Gets together: Not on file    Attends religious service: Not on file    Active member of club or organization: Not on file    Attends meetings of clubs or organizations: Not on file    Relationship status: Not on file  . Intimate partner violence:    Fear of current or ex partner: Not on file    Emotionally abused: Not on file    Physically abused: Not on file    Forced sexual activity: Not on file  Other Topics Concern  . Not on file  Social History Narrative  . Not on file    Past Surgical History:  Procedure Laterality Date  . TONSILLECTOMY    . TONSILLECTOMY AND ADENOIDECTOMY      Family History  Problem Relation Age of Onset  . Hypertension Mother   . Diabetes Father   . Prostate cancer Paternal Grandfather     Allergies  Allergen Reactions  . Compazine [Prochlorperazine Maleate] Other (See Comments)    Muscle tension    Current Outpatient Medications on File Prior to Visit  Medication Sig Dispense Refill  . ondansetron (ZOFRAN ODT) 8 MG disintegrating tablet Take 1 tablet (8 mg total) by mouth every 8 (eight) hours as needed for nausea or  vomiting. 20 tablet 0   No current facility-administered medications on file prior to visit.     BP 133/80   Pulse 66   Temp 98.1 F (36.7 C) (Oral)   Wt 160 lb 12.8 oz (72.9 kg)   SpO2 99%   BMI 25.95 kg/m       Objective:   Physical Exam  General Mental Status- Alert. General Appearance- Not in acute distress.   Skin General: Color- Normal Color. Moisture- Normal Moisture.  Neck Carotid Arteries- Normal color. Moisture- Normal Moisture. No carotid bruits. No JVD.  Chest and Lung Exam Auscultation: Breath Sounds:-Normal.  Cardiovascular Auscultation:Rythm- Regular. Murmurs & Other Heart Sounds:Auscultation of the heart reveals- No Murmurs.  Neurologic Cranial Nerve exam:- CN III-XII intact(No nystagmus), symmetric  smile. Strength:- 5/5 equal and symmetric strength both upper and lower extremities.      Assessment & Plan:  Your bp was good when I checked your bp and your bp has been consistently good when you checked. Refilled you bp medication today. Placed future orders to get cmp and lipid panel. Please schedule future labs.   Follow up date to be determined. Will be 3-6 months or as needed  [employer], [employer]

## 2018-07-20 NOTE — Patient Instructions (Signed)
Your bp was good when I checked your bp and your bp has been consistently good when you checked. Refilled you bp medication today. Placed future orders to get cmp and lipid panel. Please schedule future labs.   Follow up date to be determined. Will be 3-6 months or as needed

## 2018-10-28 ENCOUNTER — Other Ambulatory Visit: Payer: Self-pay | Admitting: Medical

## 2019-06-16 ENCOUNTER — Other Ambulatory Visit: Payer: Self-pay | Admitting: Medical

## 2019-07-03 NOTE — Telephone Encounter (Signed)
Attempted to reach pt. Received message that voice mailbox is full. Mailed letter.

## 2020-03-10 ENCOUNTER — Other Ambulatory Visit: Payer: Self-pay | Admitting: Medical

## 2020-03-10 MED ORDER — OLMESARTAN MEDOXOMIL-HCTZ 40-12.5 MG PO TABS: 1.0000 | ORAL_TABLET | Freq: Every day | ORAL | 0 refills | Status: DC

## 2020-03-10 NOTE — Telephone Encounter (Signed)
Medication: olmesartan-hydrochlorothiazide (BENICAR HCT) 40-12.5 MG tablet   Has the patient contacted their pharmacy? No. (If no, request that the patient contact the pharmacy for the refill.) (If yes, when and what did the pharmacy advise?)  Preferred Pharmacy (with phone number or street name):   [id number] [address], [place] [address]  [place], [place] [address]  Phone:  [phone] Fax:  [phone]  Agent: Please be advised that RX refills may take up to 3 business days. We ask that you follow-up with your pharmacy.

## 2020-03-10 NOTE — Telephone Encounter (Signed)
I gave refill for one month. Advise her to keep appointment n 03/17/20.

## 2020-03-10 NOTE — Telephone Encounter (Signed)
Patient has appt on 11/8 , but has not had a refill of med since 06/2019 is it ok to refill

## 2020-03-17 ENCOUNTER — Ambulatory Visit: Payer: 59 | Admitting: Medical

## 2020-03-17 ENCOUNTER — Other Ambulatory Visit: Payer: Self-pay

## 2020-03-17 VITALS — BP 123/73 | HR 66 | Temp 98.2°F | Resp 18 | Ht 66.0 in | Wt 159.2 lb

## 2020-03-17 DIAGNOSIS — Z1211 Encounter for screening for malignant neoplasm of colon: Secondary | ICD-10-CM | POA: Diagnosis not present

## 2020-03-17 DIAGNOSIS — Z0000 Encounter for general adult medical examination without abnormal findings: Secondary | ICD-10-CM

## 2020-03-17 DIAGNOSIS — Z1231 Encounter for screening mammogram for malignant neoplasm of breast: Secondary | ICD-10-CM

## 2020-03-17 DIAGNOSIS — Z124 Encounter for screening for malignant neoplasm of cervix: Secondary | ICD-10-CM

## 2020-03-17 DIAGNOSIS — Z23 Encounter for immunization: Secondary | ICD-10-CM

## 2020-03-17 DIAGNOSIS — I10 Essential (primary) hypertension: Secondary | ICD-10-CM | POA: Diagnosis not present

## 2020-03-17 DIAGNOSIS — R7989 Other specified abnormal findings of blood chemistry: Secondary | ICD-10-CM

## 2020-03-17 DIAGNOSIS — Z0001 Encounter for general adult medical examination with abnormal findings: Secondary | ICD-10-CM

## 2020-03-17 LAB — CBC WITH DIFFERENTIAL/PLATELET
Basophils Absolute: 0 10*3/uL (ref 0.0–0.1)
Basophils Relative: 0.4 % (ref 0.0–3.0)
Eosinophils Absolute: 0.1 10*3/uL (ref 0.0–0.7)
Eosinophils Relative: 1.6 % (ref 0.0–5.0)
HCT: 37.8 % (ref 36.0–46.0)
Hemoglobin: 12.3 g/dL (ref 12.0–15.0)
Lymphocytes Relative: 35.3 % (ref 12.0–46.0)
Lymphs Abs: 1.5 10*3/uL (ref 0.7–4.0)
MCHC: 32.6 g/dL (ref 30.0–36.0)
MCV: 92.1 fl (ref 78.0–100.0)
Monocytes Absolute: 0.3 10*3/uL (ref 0.1–1.0)
Monocytes Relative: 7.9 % (ref 3.0–12.0)
Neutro Abs: 2.3 10*3/uL (ref 1.4–7.7)
Neutrophils Relative %: 54.8 % (ref 43.0–77.0)
Platelets: 171 10*3/uL (ref 150.0–400.0)
RBC: 4.1 Mil/uL (ref 3.87–5.11)
RDW: 14.6 % (ref 11.5–15.5)
WBC: 4.2 10*3/uL (ref 4.0–10.5)

## 2020-03-17 LAB — LIPID PANEL
Cholesterol: 150 mg/dL (ref 0–200)
HDL: 89.9 mg/dL (ref >39.00)
LDL Cholesterol: 50 mg/dL (ref 0–99)
NonHDL: 60.42
Total CHOL/HDL Ratio: 2
Triglycerides: 52 mg/dL (ref 0.0–149.0)
VLDL: 10.4 mg/dL (ref 0.0–40.0)

## 2020-03-17 LAB — COMPREHENSIVE METABOLIC PANEL
ALT: 13 U/L (ref 0–35)
AST: 19 U/L (ref 0–37)
Albumin: 4.3 g/dL (ref 3.5–5.2)
Alkaline Phosphatase: 66 U/L (ref 39–117)
BUN: 16 mg/dL (ref 6–23)
CO2: 32 mEq/L (ref 19–32)
Calcium: 10 mg/dL (ref 8.4–10.5)
Chloride: 100 mEq/L (ref 96–112)
Creatinine, Ser: 0.98 mg/dL (ref 0.40–1.20)
GFR: 64.26 mL/min (ref >60.00)
Glucose, Bld: 80 mg/dL (ref 70–99)
Potassium: 4.3 mEq/L (ref 3.5–5.1)
Sodium: 138 mEq/L (ref 135–145)
Total Bilirubin: 0.6 mg/dL (ref 0.2–1.2)
Total Protein: 7.2 g/dL (ref 6.0–8.3)

## 2020-03-17 NOTE — Addendum Note (Signed)
Addended by: Jeronimo Greaves on: 03/17/2020 09:36 AM   Modules accepted: Orders

## 2020-03-17 NOTE — Patient Instructions (Addendum)
For you wellness exam today I have ordered cbc, cmp and lipid panel.  For low vit hx, add vit d lab.  Flu vaccine today.  Recommend exercise and healthy diet.  We will let you know lab results as they come in.  Follow up date appointment will be determined after lab review.   Referred to gi for colonscopy. Referred to gyn for screening pap. Placed mammogram order.  For htn. Continue benicar/hct. Your bp was 150/90 after 3rd check. You express being nervous. About one month ago report consistent 130/80. Please check at home when relaxed update me by my chart on readings. If not less than 140/90 then would add amlodipine.   Follow up date to be determined after lab review.

## 2020-03-17 NOTE — Progress Notes (Signed)
Subjective:    Patient ID: Abigail Johnson, female    DOB: 11/20/1960, 59 y.o.   MRN: [id number]  HPI  Pt in for follow up.   No wellness exam for a while so decided to go ahead and do wellness exam today.  Pt ran out of bp medication about one month ago. Pt states before she ran out of med her bp was 130/80.  Pt states back on med for about 6 days.  Pt states usually on first check bp was quite high. She states that is always the case when has first bp check in office. No gross motor or sensory function deficits.  Pt is fasting.  Pt had covid vaccine series back in April.  Pt overdue for pap smear.    Review of Systems  Constitutional: Negative for chills, fatigue and fever.  Respiratory: Negative for cough, chest tightness, shortness of breath and wheezing.   Cardiovascular: Negative for chest pain and palpitations.  Gastrointestinal: Negative for abdominal pain.  Musculoskeletal: Negative for back pain.  Hematological: Negative for adenopathy. Does not bruise/bleed easily.  Psychiatric/Behavioral: Negative for behavioral problems and decreased concentration. The patient is not nervous/anxious and is not hyperactive.     Past Medical History:  Diagnosis Date  . Blood transfusion complicating pregnancy   . Heart murmur   . History of chicken pox   . Hypertension   . UTI (urinary tract infection)      Social History   Socioeconomic History  . Marital status: Single    Spouse name: Not on file  . Number of children: Not on file  . Years of education: Not on file  . Highest education level: Not on file  Occupational History  . Not on file  Tobacco Use  . Smoking status: Never Smoker  . Smokeless tobacco: Never Used  Substance and Sexual Activity  . Alcohol use: Yes  . Drug use: No  . Sexual activity: Yes  Other Topics Concern  . Not on file  Social History Narrative  . Not on file   Social Determinants of Health   Financial Resource Strain:   .  Difficulty of Paying Living Expenses: Not on file  Food Insecurity:   . Worried About Charity fundraiser in the Last Year: Not on file  . Ran Out of Food in the Last Year: Not on file  Transportation Needs:   . Lack of Transportation (Medical): Not on file  . Lack of Transportation (Non-Medical): Not on file  Physical Activity:   . Days of Exercise per Week: Not on file  . Minutes of Exercise per Session: Not on file  Stress:   . Feeling of Stress : Not on file  Social Connections:   . Frequency of Communication with Friends and Family: Not on file  . Frequency of Social Gatherings with Friends and Family: Not on file  . Attends Religious Services: Not on file  . Active Member of Clubs or Organizations: Not on file  . Attends Archivist Meetings: Not on file  . Marital Status: Not on file  Intimate Partner Violence:   . Fear of Current or Ex-Partner: Not on file  . Emotionally Abused: Not on file  . Physically Abused: Not on file  . Sexually Abused: Not on file    Past Surgical History:  Procedure Laterality Date  . TONSILLECTOMY    . TONSILLECTOMY AND ADENOIDECTOMY      Family History  Problem Relation Age of Onset  .  Hypertension Mother   . Diabetes Father   . Prostate cancer Paternal Grandfather     Allergies  Allergen Reactions  . Compazine [Prochlorperazine Maleate] Other (See Comments)    Muscle tension    Current Outpatient Medications on File Prior to Visit  Medication Sig Dispense Refill  . olmesartan-hydrochlorothiazide (BENICAR HCT) 40-12.5 MG tablet Take 1 tablet by mouth daily. 30 tablet 0  . ondansetron (ZOFRAN ODT) 8 MG disintegrating tablet Take 1 tablet (8 mg total) by mouth every 8 (eight) hours as needed for nausea or vomiting. (Patient not taking: Reported on 03/17/2020) 20 tablet 0   No current facility-administered medications on file prior to visit.    BP (!) 170/112   Pulse 66   Temp 98.2 F (36.8 C) (Oral)   Resp 18   Ht 5'  6" (1.676 m)   Wt 159 lb 3.2 oz (72.2 kg)   SpO2 95%   BMI 25.70 kg/m       Objective:   Physical Exam   General Mental Status- Alert. General Appearance- Not in acute distress.   Skin General: Color- Normal Color. Moisture- Normal Moisture.  Neck Carotid Arteries- Normal color. Moisture- Normal Moisture. No carotid bruits. No JVD.  Chest and Lung Exam Auscultation: Breath Sounds:-Normal.  Cardiovascular Auscultation:Rythm- Regular. Murmurs & Other Heart Sounds:Auscultation of the heart reveals- No Murmurs.  Abdomen Inspection:-Inspeection Normal. Palpation/Percussion:Note:No mass. Palpation and Percussion of the abdomen reveal- Non Tender, Non Distended + BS, no rebound or guarding.   Neurologic Cranial Nerve exam:- CN III-XII intact(No nystagmus), symmetric smile. Strength:- 5/5 equal and symmetric strength both upper and lower extremities.     Assessment & Plan:  For you wellness exam today I have ordered cbc, cmp and lipid panel.  For low vit hx, add vit d lab.  Flu vaccine today.  Recommend exercise and healthy diet.  We will let you know lab results as they come in.  Follow up date appointment will be determined after lab review.   Referred to gi for colonscopy. Referred to gyn for screening pap. Placed mammogram order.  For htn. Continue benicar/hct. Your bp was 150/90 after 3rd check. You express being nervous. About one month ago report consistent 130/80. Please check at home when relexad update me by my chart on readings. If not less than 140/90 then would add amlodipine.   Follow up date to be determined after lab review.  Mackie Pai, [place]   [place] charge as well. Addressed and discussed quite high bp and plan going forward.

## 2020-03-18 ENCOUNTER — Telehealth: Payer: Self-pay | Admitting: Medical

## 2020-03-18 NOTE — Telephone Encounter (Signed)
Patient states her blood pressure is 123/72. Patient wanted to report her reading per PA's request

## 2020-03-18 NOTE — Telephone Encounter (Signed)
Updated pt bp.

## 2020-03-20 LAB — VITAMIN D 1,25 DIHYDROXY
Vitamin D 1, 25 (OH)2 Total: 32 pg/mL (ref 18–72)
Vitamin D2 1, 25 (OH)2: <8 pg/mL
Vitamin D3 1, 25 (OH)2: 32 pg/mL

## 2020-04-17 ENCOUNTER — Ambulatory Visit (INDEPENDENT_AMBULATORY_CARE_PROVIDER_SITE_OTHER): Payer: 59 | Admitting: Family Medicine

## 2020-04-17 ENCOUNTER — Other Ambulatory Visit: Payer: Self-pay

## 2020-04-17 ENCOUNTER — Other Ambulatory Visit (HOSPITAL_COMMUNITY)
Admission: RE | Admit: 2020-04-17 | Discharge: 2020-04-17 | Disposition: A | Discharge status: Home / Self Care | Payer: 59 | Source: Ambulatory Visit | Attending: Family Medicine | Admitting: Family Medicine

## 2020-04-17 ENCOUNTER — Encounter: Payer: Self-pay | Admitting: Family Medicine

## 2020-04-17 VITALS — BP 166/100 | HR 64 | Ht 66.0 in | Wt 162.0 lb

## 2020-04-17 DIAGNOSIS — R8290 Unspecified abnormal findings in urine: Secondary | ICD-10-CM | POA: Diagnosis not present

## 2020-04-17 DIAGNOSIS — Z01419 Encounter for gynecological examination (general) (routine) without abnormal findings: Secondary | ICD-10-CM | POA: Diagnosis present

## 2020-04-17 LAB — POCT URINALYSIS DIPSTICK
Bilirubin, UA: NEGATIVE
Glucose, UA: NEGATIVE
Ketones, UA: NEGATIVE
Leukocytes, UA: NEGATIVE
Nitrite, UA: POSITIVE
Protein, UA: NEGATIVE
Spec Grav, UA: 1.015 (ref 1.010–1.025)
Urobilinogen, UA: 0.2 E.U./dL
pH, UA: 7 (ref 5.0–8.0)

## 2020-04-17 MED ORDER — SULFAMETHOXAZOLE-TRIMETHOPRIM 800-160 MG PO TABS: 1.0000 | ORAL_TABLET | Freq: Two times a day (BID) | ORAL | 1 refills | Status: AC

## 2020-04-17 MED ORDER — FLUCONAZOLE 150 MG PO TABS: 150.0000 mg | ORAL_TABLET | Freq: Once | ORAL | 0 refills | Status: AC

## 2020-04-17 NOTE — Progress Notes (Signed)
GYNECOLOGY ANNUAL PREVENTATIVE CARE ENCOUNTER NOTE  Subjective:   Abigail Johnson is a 59 y.o. G20P3002 female here for a routine annual gynecologic exam.  Current complaints: foul smell.   Denies abnormal vaginal bleeding, discharge, pelvic pain, problems with intercourse or other gynecologic concerns.    Gynecologic History No LMP recorded. Patient is perimenopausal. Patient is sexually active  Contraception: post menopausal status Last Pap: about 3 years ago. Results were: normal Last mammogram: 2018. Results were: normal  Obstetric History OB History  Gravida Para Term Preterm AB Living  3 3 3     2   SAB IAB Ectopic Multiple Live Births          2    # Outcome Date GA Lbr Len/2nd Weight Sex Delivery Anes PTL Lv  3 Term 2000 [redacted]w[redacted]d       FD  2 Term 29 [redacted]w[redacted]d   F Vag-Spont None N LIV  1 Term 34 [redacted]w[redacted]d   M Vag-Spont None N LIV    Past Medical History:  Diagnosis Date  . Blood transfusion complicating pregnancy   . Heart murmur   . History of chicken pox   . Hypertension   . UTI (urinary tract infection)     Past Surgical History:  Procedure Laterality Date  . TONSILLECTOMY    . TONSILLECTOMY AND ADENOIDECTOMY      Current Outpatient Medications on File Prior to Visit  Medication Sig Dispense Refill  . olmesartan-hydrochlorothiazide (BENICAR HCT) 40-12.5 MG tablet Take 1 tablet by mouth daily. 30 tablet 0  . ondansetron (ZOFRAN ODT) 8 MG disintegrating tablet Take 1 tablet (8 mg total) by mouth every 8 (eight) hours as needed for nausea or vomiting. (Patient not taking: No sig reported) 20 tablet 0   No current facility-administered medications on file prior to visit.    Allergies  Allergen Reactions  . Compazine [Prochlorperazine Maleate] Other (See Comments)    Muscle tension    Social History   Socioeconomic History  . Marital status: Single    Spouse name: Not on file  . Number of children: Not on file  . Years of education: Not on file  . Highest  education level: Not on file  Occupational History  . Not on file  Tobacco Use  . Smoking status: Never Smoker  . Smokeless tobacco: Never Used  Substance and Sexual Activity  . Alcohol use: Yes  . Drug use: No  . Sexual activity: Yes  Other Topics Concern  . Not on file  Social History Narrative  . Not on file   Social Determinants of Health   Financial Resource Strain: Not on file  Food Insecurity: Not on file  Transportation Needs: Not on file  Physical Activity: Not on file  Stress: Not on file  Social Connections: Not on file  Intimate Partner Violence: Not on file    Family History  Problem Relation Age of Onset  . Hypertension Mother   . Diabetes Father   . Prostate cancer Paternal Grandfather     The following portions of the patient's history were reviewed and updated as appropriate: allergies, current medications, past family history, past medical history, past social history, past surgical history and problem list.  Review of Systems Pertinent items are noted in HPI.   Objective:  BP (!) 166/100   Pulse 64   Ht 5\' 6"  (1.676 m)   Wt 162 lb (73.5 kg)   BMI 26.15 kg/m  Wt Readings from Last 3 Encounters:  04/17/20 162 lb (73.5 kg)  03/17/20 159 lb 3.2 oz (72.2 kg)  07/20/18 160 lb 12.8 oz (72.9 kg)     Chaperone present during exam  CONSTITUTIONAL: Well-developed, well-nourished female in no acute distress.  HENT:  Normocephalic, atraumatic, External right and left ear normal. Oropharynx is clear and moist EYES: Conjunctivae and EOM are normal. Pupils are equal, round, and reactive to light. No scleral icterus.  NECK: Normal range of motion, supple, no masses.  Normal thyroid.   CARDIOVASCULAR: Normal heart rate noted, regular rhythm RESPIRATORY: Clear to auscultation bilaterally. Effort and breath sounds normal, no problems with respiration noted. BREASTS: Symmetric in size. No masses, skin changes, nipple drainage, or lymphadenopathy. ABDOMEN:  Soft, normal bowel sounds, no distention noted.  No tenderness, rebound or guarding.  PELVIC: Normal appearing external genitalia; normal appearing vaginal mucosa and cervix.  No abnormal discharge noted.  Normal uterine size, no other palpable masses, no uterine or adnexal tenderness. MUSCULOSKELETAL: Normal range of motion. No tenderness.  No cyanosis, clubbing, or edema.  2+ distal pulses. SKIN: Skin is warm and dry. No rash noted. Not diaphoretic. No erythema. No pallor. NEUROLOGIC: Alert and oriented to person, place, and time. Normal reflexes, muscle tone coordination. No cranial nerve deficit noted. PSYCHIATRIC: Normal mood and affect. Normal behavior. Normal judgment and thought content.  Assessment:  Annual gynecologic examination with pap smear   Plan:  1. Well Woman Exam Will follow up results of pap smear and manage accordingly. Mammogram scheduled - Cytology - PAP( [facility]) - MM DIGITAL SCREENING BILATERAL; Future - Ambulatory referral to Gastroenterology  2. Foul smelling urine UA shows UTI. Bactrim with diflucan prescribed. UCx sent   Routine preventative health maintenance measures emphasized. Please refer to After Visit Summary for other counseling recommendations.    [place]oma [place], [place] for [employer]

## 2020-04-21 LAB — CYTOLOGY - PAP
Comment: NEGATIVE
Diagnosis: NEGATIVE
High risk HPV: NEGATIVE

## 2020-04-21 LAB — URINE CULTURE

## 2020-05-07 ENCOUNTER — Ambulatory Visit: Payer: 59

## 2020-05-07 ENCOUNTER — Ambulatory Visit: Payer: 59 | Attending: Critical Care Medicine

## 2020-05-07 DIAGNOSIS — Z23 Encounter for immunization: Secondary | ICD-10-CM

## 2020-05-07 NOTE — Progress Notes (Signed)
   Covid-19 Vaccination Clinic  Name:  Abigail Johnson    MRN: [id number] DOB: July 23, 1960  05/07/2020  [name] was observed post Covid-19 immunization for 15 minutes without incident. She was provided with Vaccine Information Sheet and instruction to access the V-Safe system.   [name] was instructed to call 911 with any severe reactions post vaccine: Marland Kitchen Difficulty breathing  . Swelling of face and throat  . A fast heartbeat  . A bad rash all over body  . Dizziness and weakness   Immunizations Administered    Name Date Dose VIS Date Route   Moderna Covid-19 Booster Vaccine 05/07/2020 11:30 PM 0.25 mL 02/27/2020 Intramuscular   Manufacturer: Moderna   Lot: [id number]   NDC: 57262-035-59

## 2020-05-10 ENCOUNTER — Other Ambulatory Visit: Payer: Self-pay | Admitting: Medical

## 2020-06-05 ENCOUNTER — Encounter: Payer: Self-pay | Admitting: Gastroenterology

## 2020-06-05 ENCOUNTER — Ambulatory Visit (AMBULATORY_SURGERY_CENTER): Payer: Self-pay | Admitting: *Deleted

## 2020-06-05 ENCOUNTER — Other Ambulatory Visit: Payer: Self-pay

## 2020-06-05 VITALS — Ht 66.0 in | Wt 164.0 lb

## 2020-06-05 DIAGNOSIS — Z1211 Encounter for screening for malignant neoplasm of colon: Secondary | ICD-10-CM

## 2020-06-05 MED ORDER — PLENVU 140 G PO SOLR: 1.0000 | ORAL | 0 refills | Status: DC

## 2020-06-05 NOTE — Progress Notes (Signed)
No egg or soy allergy known to patient  No issues with past sedation with any surgeries or procedures No intubation problems in the past  No FH of Malignant Hyperthermia No diet pills per patient No home 02 use per patient  No blood thinners per patient  Pt denies issues with constipation  No A fib or A flutter  EMMI video to pt or via MyChart  COVID 19 guidelines implemented in PV today with Pt and RN  Pt is fully vaccinated  for Covid   Plenvu  Coupon given to pt in PV today , Code to Pharmacy and  NO PA's for preps discussed with pt  In PV today   Due to the COVID-19 pandemic we are asking patients to follow certain guidelines.  Pt aware of COVID protocols and [facility] guidelines   

## 2020-06-19 ENCOUNTER — Encounter: Payer: Self-pay | Admitting: Gastroenterology

## 2020-06-19 ENCOUNTER — Ambulatory Visit (AMBULATORY_SURGERY_CENTER): Payer: 59 | Admitting: Gastroenterology

## 2020-06-19 ENCOUNTER — Other Ambulatory Visit: Payer: Self-pay

## 2020-06-19 VITALS — BP 167/105 | HR 56 | Temp 97.3°F | Resp 24 | Ht 66.0 in | Wt 164.0 lb

## 2020-06-19 DIAGNOSIS — Z1211 Encounter for screening for malignant neoplasm of colon: Secondary | ICD-10-CM

## 2020-06-19 DIAGNOSIS — D120 Benign neoplasm of cecum: Secondary | ICD-10-CM

## 2020-06-19 MED ORDER — SODIUM CHLORIDE 0.9 % IV SOLN: 500.0000 mL | Freq: Once | INTRAVENOUS | Status: DC

## 2020-06-19 NOTE — Progress Notes (Signed)
Medical history reviewed with no changes noted. VS assessed by Eugenie Norrie, RN

## 2020-06-19 NOTE — Progress Notes (Signed)
Called to room to assist during endoscopic procedure.  Patient ID and intended procedure confirmed with present staff. Received instructions for my participation in the procedure from the performing physician.  

## 2020-06-19 NOTE — Patient Instructions (Signed)
YOU HAD AN ENDOSCOPIC PROCEDURE TODAY AT [facility]:   Refer to the procedure report that was given to you for any specific questions about what was found during the examination.  If the procedure report does not answer your questions, please call your gastroenterologist to clarify.  If you requested that your care partner not be given the details of your procedure findings, then the procedure report has been included in a sealed envelope for you to review at your convenience later.  **Handout given on Polyps and Diverticulosis**   YOU SHOULD EXPECT: Some feelings of bloating in the abdomen. Passage of more gas than usual.  Walking can help get rid of the air that was put into your GI tract during the procedure and reduce the bloating. If you had a lower endoscopy (such as a colonoscopy or flexible sigmoidoscopy) you may notice spotting of blood in your stool or on the toilet paper. If you underwent a bowel prep for your procedure, you may not have a normal bowel movement for a few days.  Please Note:  You might notice some irritation and congestion in your nose or some drainage.  This is from the oxygen used during your procedure.  There is no need for concern and it should clear up in a day or so.  SYMPTOMS TO REPORT IMMEDIATELY:   Following lower endoscopy (colonoscopy or flexible sigmoidoscopy):  Excessive amounts of blood in the stool  Significant tenderness or worsening of abdominal pains  Swelling of the abdomen that is new, acute  Fever of 100F or higher  For urgent or emergent issues, a gastroenterologist can be reached at any hour by calling [phone]. Do not use MyChart messaging for urgent concerns.    DIET:  We do recommend a small meal at first, but then you may proceed to your regular diet.  Drink plenty of fluids but you should avoid alcoholic beverages for 24 hours.  ACTIVITY:  You should plan to take it easy for the rest of today and you should NOT  DRIVE or use heavy machinery until tomorrow (because of the sedation medicines used during the test).    FOLLOW UP: Our staff will call the number listed on your records 48-72 hours following your procedure to check on you and address any questions or concerns that you may have regarding the information given to you following your procedure. If we do not reach you, we will leave a message.  We will attempt to reach you two times.  During this call, we will ask if you have developed any symptoms of COVID 19. If you develop any symptoms (ie: fever, flu-like symptoms, shortness of breath, cough etc.) before then, please call [phone].  If you test positive for Covid 19 in the 2 weeks post procedure, please call and report this information to [place].    If any biopsies were taken you will be contacted by phone or by letter within the next 1-3 weeks.  Please call us at [phone] if you have not heard about the biopsies in 3 weeks.    SIGNATURES/CONFIDENTIALITY: You and/or your care partner have signed paperwork which will be entered into your electronic medical record.  These signatures attest to the fact that that the information above on your After Visit Summary has been reviewed and is understood.  Full responsibility of the confidentiality of this discharge information lies with you and/or your care-partner.

## 2020-06-19 NOTE — Progress Notes (Signed)
PT taken to PACU. Monitors in place. VSS. Report given to RN. 

## 2020-06-19 NOTE — Op Note (Signed)
[facility] Patient Name: Abigail Johnson Procedure Date: 06/19/2020 9:20 AM MRN: [id number] Endoscopist: Mallie Mussel L. Loletha Carrow , MD Age: 60 Referring MD:  Date of Birth: 21-Oct-1960 Gender: Female Account #: [id number] Procedure:                Colonoscopy Indications:              Screening for colorectal malignant neoplasm, This                            is the patient's first colonoscopy Medicines:                Monitored Anesthesia Care Procedure:                Pre-Anesthesia Assessment:                           - Prior to the procedure, a History and Physical                            was performed, and patient medications and                            allergies were reviewed. The patient's tolerance of                            previous anesthesia was also reviewed. The risks                            and benefits of the procedure and the sedation                            options and risks were discussed with the patient.                            All questions were answered, and informed consent                            was obtained. Prior Anticoagulants: The patient has                            taken no previous anticoagulant or antiplatelet                            agents. ASA Grade Assessment: II - A patient with                            mild systemic disease. After reviewing the risks                            and benefits, the patient was deemed in                            satisfactory condition to undergo the procedure.  After obtaining informed consent, the colonoscope                            was passed under direct vision. Throughout the                            procedure, the patient's blood pressure, pulse, and                            oxygen saturations were monitored continuously. The                            Olympus CF-HQ190L ([id number]) Colonoscope was                            introduced through the anus  and advanced to the the                            cecum, identified by appendiceal orifice and                            ileocecal valve. The colonoscopy was performed                            without difficulty. The patient tolerated the                            procedure well. The quality of the bowel                            preparation was excellent. The ileocecal valve,                            appendiceal orifice, and rectum were photographed.                            The bowel preparation used was Plenvu. Scope In: 9:36:41 AM Scope Out: 9:51:10 AM Scope Withdrawal Time: 0 hours 10 minutes 22 seconds  Total Procedure Duration: 0 hours 14 minutes 29 seconds  Findings:                 The perianal and digital rectal examinations were                            normal.                           A few small-mouthed diverticula were found in the                            left colon and right colon.                           A diminutive polyp was found in the cecum. The  polyp was sessile. The polyp was removed with a                            cold snare. Resection and retrieval were complete.                           The exam was otherwise without abnormality on                            direct and retroflexion views. Complications:            No immediate complications. Estimated Blood Loss:     Estimated blood loss was minimal. Impression:               - Diverticulosis in the left colon and in the right                            colon.                           - One diminutive polyp in the cecum, removed with a                            cold snare. Resected and retrieved.                           - The examination was otherwise normal on direct                            and retroflexion views. Recommendation:           - Patient has a contact number available for                            emergencies. The signs and symptoms of  potential                            delayed complications were discussed with the                            patient. Return to normal activities tomorrow.                            Written discharge instructions were provided to the                            patient.                           - Resume previous diet.                           - Continue present medications.                           - Await pathology results.                           -  Repeat colonoscopy is recommended for                            surveillance. The colonoscopy date will be                            determined after pathology results from today's                            exam become available for review. Henry L. Loletha Carrow, MD 06/19/2020 9:55:39 AM This report has been signed electronically.

## 2020-06-23 ENCOUNTER — Telehealth: Payer: Self-pay | Admitting: *Deleted

## 2020-06-23 NOTE — Telephone Encounter (Signed)
Attempted f/u phone call. No answer. Left message. °

## 2020-06-23 NOTE — Telephone Encounter (Signed)
  Follow up Call-  Call back number 06/19/2020  Post procedure Call Back phone  # [phone]  Permission to leave phone message Yes  Some recent data might be hidden     Patient questions:  Do you have a fever, pain , or abdominal swelling? No. Pain Score  0 *  Have you tolerated food without any problems? Yes.    Have you been able to return to your normal activities? Yes.    Do you have any questions about your discharge instructions: Diet   No. Medications  No. Follow up visit  No.  Do you have questions or concerns about your Care? No.  Actions: * If pain score is 4 or above: No action needed, pain <4.  1. Have you developed a fever since your procedure? no  2.   Have you had an respiratory symptoms (SOB or cough) since your procedure? no  3.   Have you tested positive for COVID 19 since your procedure no  4.   Have you had any family members/close contacts diagnosed with the COVID 19 since your procedure?  no   If yes to any of these questions please route to Joylene John, RN and Joella Prince, RN

## 2020-06-26 ENCOUNTER — Encounter: Payer: Self-pay | Admitting: Gastroenterology

## 2020-07-01 ENCOUNTER — Encounter (HOSPITAL_BASED_OUTPATIENT_CLINIC_OR_DEPARTMENT_OTHER): Payer: Self-pay

## 2020-07-01 ENCOUNTER — Other Ambulatory Visit: Payer: Self-pay

## 2020-07-01 ENCOUNTER — Ambulatory Visit (HOSPITAL_BASED_OUTPATIENT_CLINIC_OR_DEPARTMENT_OTHER)
Admission: RE | Admit: 2020-07-01 | Discharge: 2020-07-01 | Disposition: A | Discharge status: Home / Self Care | Payer: 59 | Source: Ambulatory Visit | Attending: Family Medicine | Admitting: Family Medicine

## 2020-07-01 DIAGNOSIS — Z01419 Encounter for gynecological examination (general) (routine) without abnormal findings: Secondary | ICD-10-CM

## 2020-07-01 DIAGNOSIS — Z1231 Encounter for screening mammogram for malignant neoplasm of breast: Secondary | ICD-10-CM | POA: Insufficient documentation

## 2020-09-09 ENCOUNTER — Other Ambulatory Visit: Payer: Self-pay | Admitting: Medical

## 2020-10-06 ENCOUNTER — Other Ambulatory Visit: Payer: Self-pay | Admitting: Medical

## 2020-11-08 ENCOUNTER — Other Ambulatory Visit: Payer: Self-pay | Admitting: Medical

## 2020-11-25 ENCOUNTER — Other Ambulatory Visit: Payer: Self-pay | Admitting: Medical

## 2020-12-11 ENCOUNTER — Telehealth: Payer: Self-pay

## 2020-12-11 NOTE — Telephone Encounter (Signed)
Pt called back and appt made

## 2020-12-11 NOTE — Telephone Encounter (Signed)
Pt called and lvm to return call to schedule an appointment 

## 2020-12-11 NOTE — Telephone Encounter (Signed)
Pt states that she has the urge to urinate but when she does it a little bit.

## 2020-12-15 ENCOUNTER — Ambulatory Visit: Payer: 59 | Admitting: Family Medicine

## 2020-12-18 ENCOUNTER — Ambulatory Visit: Payer: 59 | Admitting: Medical

## 2020-12-18 ENCOUNTER — Encounter: Payer: Self-pay | Admitting: Medical

## 2020-12-18 ENCOUNTER — Other Ambulatory Visit: Payer: Self-pay

## 2020-12-18 VITALS — BP 149/92 | HR 69 | Resp 18 | Ht 66.0 in | Wt 164.8 lb

## 2020-12-18 DIAGNOSIS — R300 Dysuria: Secondary | ICD-10-CM | POA: Diagnosis not present

## 2020-12-18 DIAGNOSIS — I10 Essential (primary) hypertension: Secondary | ICD-10-CM

## 2020-12-18 LAB — COMPREHENSIVE METABOLIC PANEL
ALT: 15 U/L (ref 0–35)
AST: 22 U/L (ref 0–37)
Albumin: 4.4 g/dL (ref 3.5–5.2)
Alkaline Phosphatase: 70 U/L (ref 39–117)
BUN: 12 mg/dL (ref 6–23)
CO2: 30 mEq/L (ref 19–32)
Calcium: 10 mg/dL (ref 8.4–10.5)
Chloride: 98 mEq/L (ref 96–112)
Creatinine, Ser: 0.94 mg/dL (ref 0.40–1.20)
GFR: 67.2 mL/min (ref >60.00)
Glucose, Bld: 75 mg/dL (ref 70–99)
Potassium: 4.4 mEq/L (ref 3.5–5.1)
Sodium: 138 mEq/L (ref 135–145)
Total Bilirubin: 0.5 mg/dL (ref 0.2–1.2)
Total Protein: 7.8 g/dL (ref 6.0–8.3)

## 2020-12-18 LAB — POC URINALSYSI DIPSTICK (AUTOMATED)
Bilirubin, UA: NEGATIVE
Blood, UA: NEGATIVE
Glucose, UA: NEGATIVE
Ketones, UA: NEGATIVE
Nitrite, UA: NEGATIVE
Protein, UA: NEGATIVE
Spec Grav, UA: <=1.005 — AB (ref 1.010–1.025)
Urobilinogen, UA: 0.2 E.U./dL
pH, UA: 7.5 (ref 5.0–8.0)

## 2020-12-18 MED ORDER — NITROFURANTOIN MONOHYD MACRO 100 MG PO CAPS: 100.0000 mg | ORAL_CAPSULE | Freq: Two times a day (BID) | ORAL | 0 refills | Status: DC

## 2020-12-18 MED ORDER — OLMESARTAN MEDOXOMIL-HCTZ 40-25 MG PO TABS: 1.0000 | ORAL_TABLET | Freq: Every day | ORAL | 0 refills | Status: DC

## 2020-12-18 NOTE — Addendum Note (Signed)
Addended by: Jeronimo Greaves on: 12/18/2020 09:26 AM   Modules accepted: Orders

## 2020-12-18 NOTE — Patient Instructions (Addendum)
You appear to have a urinary tract infection. I am prescribing macrobid  antibiotic for the probable infection. Hydrate well. I am sending out a urine culture. During the interim if your signs and symptoms worsen rather than improving please notify us. We will notify your when the culture results are back.  Your bp was initially very high and on recheck better but still high. Will rx benicar 40/25 mg daily dose. DC the 40/12.5 mg dose.  Get cmp today. Check bp daily and update me on reading by my chart in one week. Sooner if bp higher than today.  Also ask you schedule wellness exam for November.  Follow up date to be determined after bp review and after urine culture result.

## 2020-12-18 NOTE — Progress Notes (Signed)
Subjective:    Patient ID: Abigail Johnson, female    DOB: [birth year], 59 y.o.   MRN: [id number]  HPI Pt in today reporting urinary symptoms for one week.   Dysuria- yes. Slight. Frequent urination-yes.  Hesitancy-no Suprapubic pressure- on pressure over bladder. Fever-no chills-no Nausea-no Vomiting-no CVA pain-no History of UTI-yes Gross hematuria- no   Hx of htn. Pt is on benicar 40/12.5. Initial bp high. No cardiac or neurologic signs or symptoms..    Review of Systems  Constitutional:  Negative for chills, fatigue and fever.  HENT:  Negative for congestion, drooling and ear pain.   Respiratory:  Negative for cough, chest tightness, shortness of breath and wheezing.   Cardiovascular:  Negative for chest pain and palpitations.  Gastrointestinal:  Negative for abdominal pain.  Genitourinary:  Positive for dysuria, frequency and urgency. Negative for difficulty urinating, flank pain, hematuria, pelvic pain and vaginal pain.  Musculoskeletal:  Negative for back pain.  Skin:  Negative for rash.    Past Medical History:  Diagnosis Date   Anxiety    Blood transfusion complicating pregnancy    Heart murmur    History of chicken pox    Hypertension    UTI (urinary tract infection)      Social History   Socioeconomic History   Marital status: Single    Spouse name: Not on file   Number of children: Not on file   Years of education: Not on file   Highest education level: Not on file  Occupational History   Not on file  Tobacco Use   Smoking status: Never   Smokeless tobacco: Never  Substance and Sexual Activity   Alcohol use: Yes    Comment: occ wine    Drug use: No   Sexual activity: Yes  Other Topics Concern   Not on file  Social History Narrative   Not on file   Social Determinants of Health   Financial Resource Strain: Not on file  Food Insecurity: Not on file  Transportation Needs: Not on file  Physical Activity: Not on file  Stress: Not on file   Social Connections: Not on file  Intimate Partner Violence: Not on file    Past Surgical History:  Procedure Laterality Date   ECTOPIC PREGNANCY SURGERY     TONSILLECTOMY     age 45     Family History  Problem Relation Age of Onset   Hypertension Mother    Diabetes Father    Prostate cancer Paternal Grandfather    Colon cancer Neg Hx    Colon polyps Neg Hx    Esophageal cancer Neg Hx    Rectal cancer Neg Hx    Stomach cancer Neg Hx     Allergies  Allergen Reactions   Compazine [Prochlorperazine Maleate] Other (See Comments)    Muscle tension    Current Outpatient Medications on File Prior to Visit  Medication Sig Dispense Refill   Multiple Vitamins-Minerals (MULTIVITAMIN WOMEN PO) Take by mouth.     olmesartan-hydrochlorothiazide (BENICAR HCT) 40-12.5 MG tablet TAKE 1 TABLET BY MOUTH EVERY DAY 30 tablet 0   TURMERIC PO Take by mouth.     No current facility-administered medications on file prior to visit.    BP (!) 156/113   Pulse 69   Resp 18   Ht '5\' 6"'$  (1.676 m)   Wt 164 lb 12.8 oz (74.8 kg)   LMP 03/11/2016   SpO2 100%   BMI 26.60 kg/m  Objective:   Physical Exam  General- No acute distress. Pleasant patient. Neck- Full range of motion, no jvd Lungs- Clear, even and unlabored. Heart- regular rate and rhythm. Neurologic- CNII- XII grossly intact.  Abdomen- soft, nt, nd, +bs, no rebound or guarding. No suprapubic pressure. Back- no cva tenderness.      Assessment & Plan:   You appear to have a urinary tract infection. I am prescribing macrobid  antibiotic for the probable infection. Hydrate well. I am sending out a urine culture. During the interim if your signs and symptoms worsen rather than improving please notify us. We will notify your when the culture results are back.  Your bp was initially very high and on recheck better but still high. Will rx benicar 40/25 mg daily dose. DC the 40/12.5 mg dose.  Get cmp today. Check bp daily and  update me on reading by my chart in one week. Sooner if bp higher than today.  Also ask you schedule wellness exam for November.  Follow up date to be determined after bp review and after urine culture result.  Mackie Pai, PA-C

## 2020-12-20 LAB — URINE CULTURE
MICRO NUMBER:: 12230515
SPECIMEN QUALITY:: ADEQUATE

## 2020-12-21 MED ORDER — CIPROFLOXACIN HCL 500 MG PO TABS: 500.0000 mg | ORAL_TABLET | Freq: Two times a day (BID) | ORAL | 0 refills | Status: DC

## 2020-12-21 NOTE — Addendum Note (Signed)
Addended by: Anabel Halon on: 12/21/2020 09:56 AM   Modules accepted: Orders

## 2021-01-09 ENCOUNTER — Other Ambulatory Visit: Payer: Self-pay | Admitting: Medical

## 2021-01-29 ENCOUNTER — Other Ambulatory Visit: Payer: Self-pay | Admitting: Medical

## 2021-08-18 ENCOUNTER — Telehealth: Payer: Self-pay | Admitting: Pharmacist

## 2021-08-18 NOTE — Telephone Encounter (Signed)
Patient appearing on report for [facility] Hypertension Control report due to last documented ambulatory blood pressure of 149/92 on 12/29/2020. Next appointment with PCP is not yet scheduled  ? ? ? ?Current medications:  olmesartan hydrochlorothiazide 40/'25mg'$  once a day  - per Epic refill records adherence is low - filled 30 days on 12/18/20; 05/13/2021 and 07/31/2021.  ? ?Outreached patient to discuss hypertension control and medication management / adherence. Unable to reach patient. LM on VM with my contact information [phone] and [phone].  ?

## 2021-12-08 ENCOUNTER — Ambulatory Visit: Payer: 59 | Admitting: Medical

## 2021-12-08 VITALS — BP 125/70 | HR 58 | Resp 18 | Ht 66.0 in | Wt 154.2 lb

## 2021-12-08 DIAGNOSIS — I10 Essential (primary) hypertension: Secondary | ICD-10-CM | POA: Diagnosis not present

## 2021-12-08 DIAGNOSIS — M1812 Unilateral primary osteoarthritis of first carpometacarpal joint, left hand: Secondary | ICD-10-CM

## 2021-12-08 DIAGNOSIS — R350 Frequency of micturition: Secondary | ICD-10-CM

## 2021-12-08 LAB — COMPREHENSIVE METABOLIC PANEL
ALT: 8 U/L (ref 0–35)
AST: 14 U/L (ref 0–37)
Albumin: 4.3 g/dL (ref 3.5–5.2)
Alkaline Phosphatase: 69 U/L (ref 39–117)
BUN: 19 mg/dL (ref 6–23)
CO2: 29 mEq/L (ref 19–32)
Calcium: 9.8 mg/dL (ref 8.4–10.5)
Chloride: 100 mEq/L (ref 96–112)
Creatinine, Ser: 1 mg/dL (ref 0.40–1.20)
GFR: 61.96 mL/min (ref >60.00)
Glucose, Bld: 81 mg/dL (ref 70–99)
Potassium: 4.2 mEq/L (ref 3.5–5.1)
Sodium: 137 mEq/L (ref 135–145)
Total Bilirubin: 0.4 mg/dL (ref 0.2–1.2)
Total Protein: 7.7 g/dL (ref 6.0–8.3)

## 2021-12-08 LAB — POCT URINALYSIS DIPSTICK
Bilirubin, UA: NEGATIVE
Blood, UA: NEGATIVE
Glucose, UA: NEGATIVE
Ketones, UA: NEGATIVE
Nitrite, UA: NEGATIVE
Protein, UA: NEGATIVE
Spec Grav, UA: 1.01 (ref 1.010–1.025)
Urobilinogen, UA: 0.2 E.U./dL
pH, UA: 7 (ref 5.0–8.0)

## 2021-12-08 MED ORDER — OLMESARTAN MEDOXOMIL-HCTZ 40-25 MG PO TABS: 1.0000 | ORAL_TABLET | Freq: Every day | ORAL | 11 refills | Status: DC

## 2021-12-08 MED ORDER — NITROFURANTOIN MONOHYD MACRO 100 MG PO CAPS: 100.0000 mg | ORAL_CAPSULE | Freq: Two times a day (BID) | ORAL | 0 refills | Status: DC

## 2021-12-08 NOTE — Patient Instructions (Addendum)
Your appear to have probable urinary tract infection. I am prescribing macrobid antibiotic for the probable infection. Hydrate well. I am sending out a urine culture. During the interim if your signs and symptoms worsen rather than improving please notify us. We will notify your when the culture results are back.  Htn well controlled on recheck. Continue olmesartan/hctz. Will check cmp. Follow kidney function and potassium level.  Follow up in up date to be determined after lab review and depending on response to treatment.

## 2021-12-08 NOTE — Addendum Note (Signed)
Addended by: Anabel Halon on: 12/08/2021 10:43 AM   Modules accepted: Orders

## 2021-12-08 NOTE — Addendum Note (Signed)
Addended by: Anabel Halon on: 12/08/2021 10:55 AM   Modules accepted: Orders

## 2021-12-08 NOTE — Progress Notes (Signed)
Subjective:    Patient ID: Abigail Johnson, female    DOB: 10-Oct-1960, 61 y.o.   MRN: [id number]  HPI Pt in today reporting urinary symptoms. For one week.  Dysuria- no Frequent urination-yes Hesitancy-no Suprapubic pressure-no Fever-no  chills-no Nausea-no Vomiting-no CVA pain-no History of UTI-1-2 time a year approximate. Gross hematuria- no   Htn- pt is on omesartan/hctz. Bp initially high but better on recheck.   Review of Systems  Constitutional:  Negative for chills, fatigue and fever.  Respiratory:  Negative for cough, chest tightness, shortness of breath and wheezing.   Cardiovascular:  Negative for chest pain and palpitations.  Gastrointestinal:  Negative for abdominal pain, constipation and nausea.  Genitourinary:  Positive for frequency. Negative for dysuria, pelvic pain and urgency.  Musculoskeletal:  Negative for back pain.  Skin:  Negative for rash.  Neurological:  Negative for dizziness and headaches.  Hematological:  Negative for adenopathy. Does not bruise/bleed easily.  Psychiatric/Behavioral:  Negative for behavioral problems and confusion.     Past Medical History:  Diagnosis Date   Anxiety    Blood transfusion complicating pregnancy    Heart murmur    History of chicken pox    Hypertension    UTI (urinary tract infection)      Social History   Socioeconomic History   Marital status: Single    Spouse name: Not on file   Number of children: Not on file   Years of education: Not on file   Highest education level: Not on file  Occupational History   Not on file  Tobacco Use   Smoking status: Never   Smokeless tobacco: Never  Substance and Sexual Activity   Alcohol use: Yes    Comment: occ wine    Drug use: No   Sexual activity: Yes  Other Topics Concern   Not on file  Social History Narrative   Not on file   Social Determinants of Health   Financial Resource Strain: Not on file  Food Insecurity: Not on file  Transportation  Needs: Not on file  Physical Activity: Not on file  Stress: Not on file  Social Connections: Not on file  Intimate Partner Violence: Not on file    Past Surgical History:  Procedure Laterality Date   ECTOPIC PREGNANCY SURGERY     TONSILLECTOMY     age 72     Family History  Problem Relation Age of Onset   Hypertension Mother    Diabetes Father    Prostate cancer Paternal Grandfather    Colon cancer Neg Hx    Colon polyps Neg Hx    Esophageal cancer Neg Hx    Rectal cancer Neg Hx    Stomach cancer Neg Hx     Allergies  Allergen Reactions   Compazine [Prochlorperazine Maleate] Other (See Comments)    Muscle tension    Current Outpatient Medications on File Prior to Visit  Medication Sig Dispense Refill   Multiple Vitamins-Minerals (MULTIVITAMIN WOMEN PO) Take by mouth.     olmesartan-hydrochlorothiazide (BENICAR HCT) 40-25 MG tablet TAKE 1 TABLET BY MOUTH EVERY DAY 90 tablet 1   TURMERIC PO Take by mouth.     No current facility-administered medications on file prior to visit.    BP (!) 130/90   Pulse (!) 58   Resp 18   Ht '5\' 6"'$  (1.676 m)   Wt 154 lb 3.2 oz (69.9 kg)   LMP 03/11/2016   SpO2 100%   BMI 24.89 kg/m  Objective:   Physical Exam   General- No acute distress. Pleasant patient. Neck- Full range of motion, no jvd Lungs- Clear, even and unlabored. Heart- regular rate and rhythm. Neurologic- CNII- XII grossly intact.  Abdomen- soft, nt, nd, +bs,no rebound or guarding. Back- no cva tenderness.       Assessment & Plan:   Patient Instructions  Your appear to have probable urinary tract infection. I am prescribing macrobid antibiotic for the probable infection. Hydrate well. I am sending out a urine culture. During the interim if your signs and symptoms worsen rather than improving please notify us. We will notify your when the culture results are back.  Htn well controlled on recheck. Continue olmesartan/hctz. Will check cmp. Follow  kidney function and potassium level.  Follow up in up date to be determined after lab review and depending on response to treatment.   Mackie Pai, PA-C

## 2021-12-11 LAB — URINE CULTURE
MICRO NUMBER:: 13720396
SPECIMEN QUALITY:: ADEQUATE

## 2022-02-23 ENCOUNTER — Encounter: Payer: 59 | Admitting: Medical

## 2022-04-08 IMAGING — MG DIGITAL SCREENING BILAT W/ CAD
4 series · 4 of 4 positions shown · non-contrast
Comparison: Previous exam(s).

CLINICAL DATA: Screening.

EXAM:
DIGITAL SCREENING BILATERAL MAMMOGRAM WITH CAD
TECHNIQUE: Bilateral screening digital craniocaudal and mediolateral oblique
mammograms were obtained. The images were evaluated with
computer-aided detection.

[L CC]
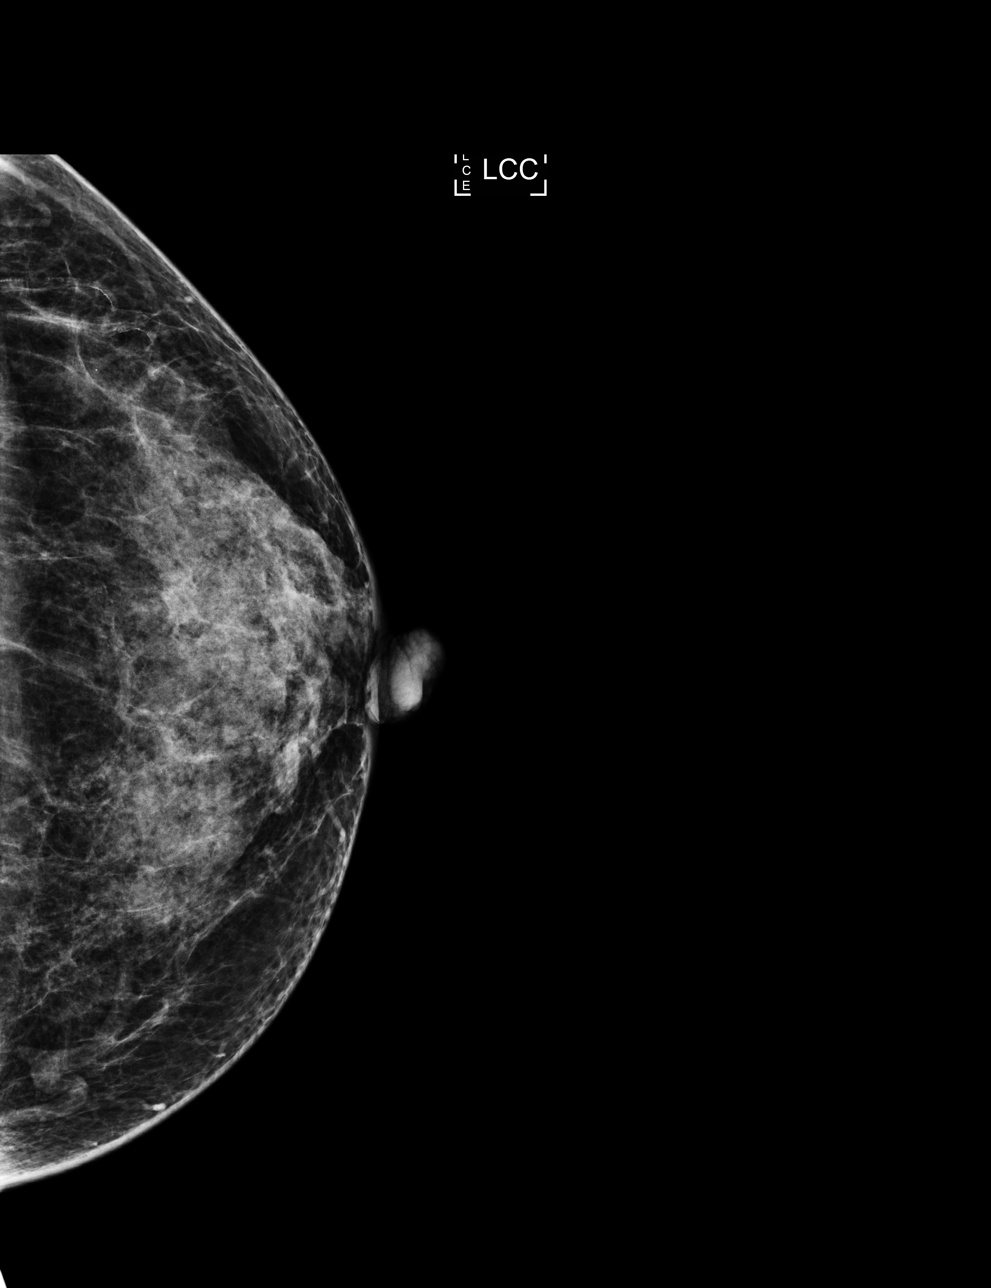

[L MLO]
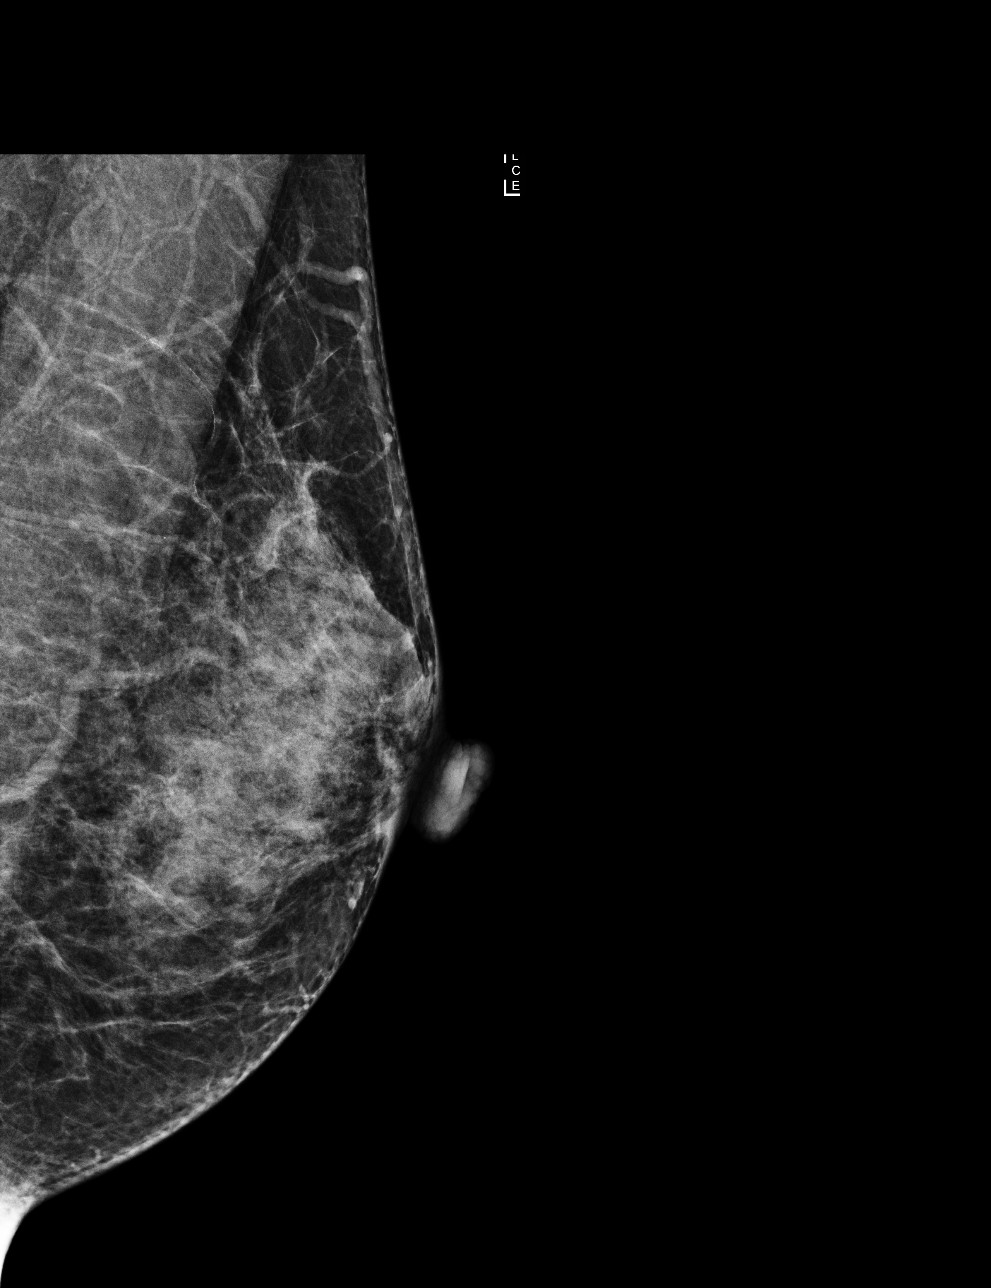

[R CC]
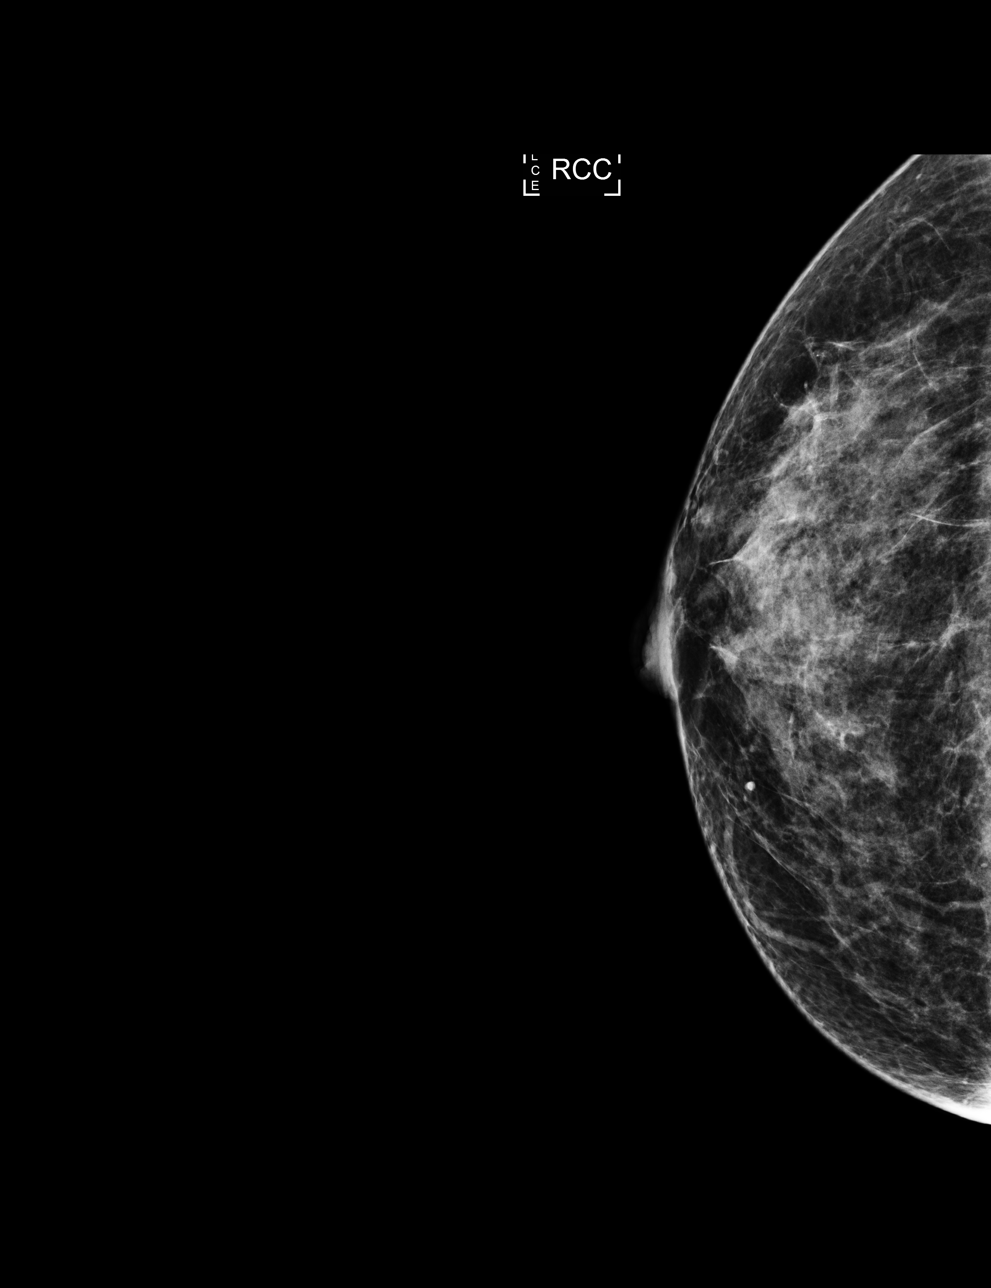

[R MLO]
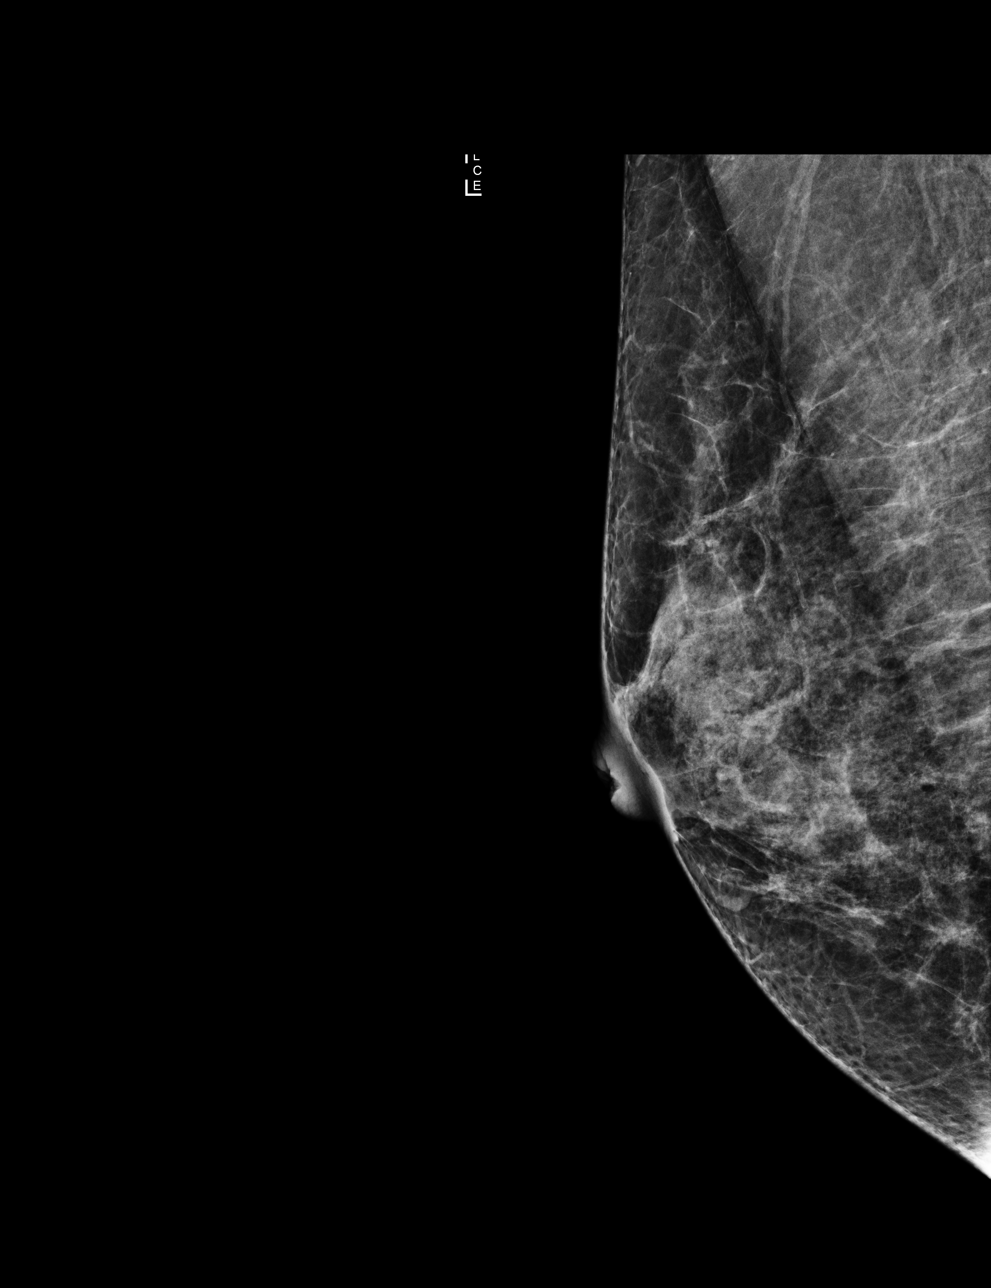

[4 of 4 positions shown; findings below may reference images not displayed]

ACR Breast Density Category c: The breast tissue is heterogeneously
dense, which may obscure small masses.
FINDINGS: There are no findings suspicious for malignancy.
IMPRESSION: No mammographic evidence of malignancy. A result letter of this
screening mammogram will be mailed directly to the patient.

RECOMMENDATION:
Screening mammogram in one year. (Code:TJ-B-ZPA)

BI-RADS CATEGORY  1: Negative.

## 2022-12-23 ENCOUNTER — Other Ambulatory Visit: Payer: Self-pay | Admitting: Medical

## 2022-12-23 ENCOUNTER — Encounter (INDEPENDENT_AMBULATORY_CARE_PROVIDER_SITE_OTHER): Payer: Self-pay

## 2023-01-12 ENCOUNTER — Ambulatory Visit (INDEPENDENT_AMBULATORY_CARE_PROVIDER_SITE_OTHER): Payer: 59 | Admitting: Medical

## 2023-01-12 ENCOUNTER — Encounter: Payer: Self-pay | Admitting: Medical

## 2023-01-12 VITALS — BP 170/98 | HR 64 | Resp 18 | Ht 66.0 in | Wt 149.0 lb

## 2023-01-12 DIAGNOSIS — M79642 Pain in left hand: Secondary | ICD-10-CM

## 2023-01-12 DIAGNOSIS — Z0000 Encounter for general adult medical examination without abnormal findings: Secondary | ICD-10-CM

## 2023-01-12 DIAGNOSIS — Z0001 Encounter for general adult medical examination with abnormal findings: Secondary | ICD-10-CM

## 2023-01-12 DIAGNOSIS — I10 Essential (primary) hypertension: Secondary | ICD-10-CM

## 2023-01-12 DIAGNOSIS — Z23 Encounter for immunization: Secondary | ICD-10-CM | POA: Diagnosis not present

## 2023-01-12 DIAGNOSIS — Z1231 Encounter for screening mammogram for malignant neoplasm of breast: Secondary | ICD-10-CM

## 2023-01-12 DIAGNOSIS — M79645 Pain in left finger(s): Secondary | ICD-10-CM

## 2023-01-12 MED ORDER — AMLODIPINE BESYLATE 10 MG PO TABS: 10.0000 mg | ORAL_TABLET | Freq: Every day | ORAL | 3 refills | Status: AC

## 2023-01-12 NOTE — Addendum Note (Signed)
Addended by: Maximino Sarin on: 01/12/2023 09:50 AM   Modules accepted: Orders

## 2023-01-12 NOTE — Progress Notes (Signed)
Subjective:    Patient ID: Abigail Johnson, female    DOB: 13-Apr-1961, 62 y.o.   MRN: [id number]  HPI  Pt in for wellness exam.   Pt is fasting. No exercise. Working from home old [employer]. Eating healthy per pt. Non smoker. No alcohol. No sodas. Pt got married December 31, 2022.  Discussed flu vaccine and tdap today. She will get today. Also discussed shingrix vaccine. She will get shingrix on different date.   Placed order screening mammogram today.  Pt will self schedule gyn appt for pap.  Up to coloscopy up to date until 2029.  Htn- bp initially very high. Pt states her bp is always high here on first check. No cardiac or neurologic signs or symptoms. Pt took bp med this morning.  Pt also notes some bilateral hand pain intermittently. She describes pain and some decreased strength of thumbs. Point to pain at base of thumb. No tingling to fingers. Pain noted when she types at work. Left side hurts worse. Pt is rt handed.    Review of Systems  Constitutional:  Negative for chills, fatigue and fever.  HENT:  Negative for congestion, ear pain and sinus pressure.   Respiratory:  Negative for cough, chest tightness, shortness of breath and wheezing.   Cardiovascular:  Negative for chest pain and palpitations.  Gastrointestinal:  Negative for abdominal pain, blood in stool, diarrhea and nausea.  Genitourinary:  Negative for dysuria, flank pain and frequency.  Musculoskeletal:  Negative for back pain and joint swelling.  Skin:  Negative for rash.  Neurological:  Negative for dizziness, speech difficulty, weakness and light-headedness.  Hematological:  Negative for adenopathy. Does not bruise/bleed easily.  Psychiatric/Behavioral:  Negative for behavioral problems, decreased concentration and hallucinations.        Past Medical History:  Diagnosis Date   Anxiety    Blood transfusion complicating pregnancy    Heart murmur    History of chicken pox    Hypertension     UTI (urinary tract infection)      Social History   Socioeconomic History   Marital status: Single    Spouse name: Not on file   Number of children: Not on file   Years of education: Not on file   Highest education level: Not on file  Occupational History   Not on file  Tobacco Use   Smoking status: Never   Smokeless tobacco: Never  Substance and Sexual Activity   Alcohol use: Yes    Comment: occ wine    Drug use: No   Sexual activity: Yes  Other Topics Concern   Not on file  Social History Narrative   Not on file   Social Determinants of Health   Financial Resource Strain: Not on file  Food Insecurity: Not on file  Transportation Needs: Not on file  Physical Activity: Not on file  Stress: Not on file  Social Connections: Not on file  Intimate Partner Violence: Not on file    Past Surgical History:  Procedure Laterality Date   ECTOPIC PREGNANCY SURGERY     TONSILLECTOMY     age 51     Family History  Problem Relation Age of Onset   Hypertension Mother    Diabetes Father    Prostate cancer Paternal Grandfather    Colon cancer Neg Hx    Colon polyps Neg Hx    Esophageal cancer Neg Hx    Rectal cancer Neg Hx    Stomach cancer Neg  Hx     Allergies  Allergen Reactions   Compazine [Prochlorperazine Maleate] Other (See Comments)    Muscle tension    Current Outpatient Medications on File Prior to Visit  Medication Sig Dispense Refill   Multiple Vitamins-Minerals (MULTIVITAMIN WOMEN PO) Take by mouth.     olmesartan-hydrochlorothiazide (BENICAR HCT) 40-25 MG tablet TAKE 1 TABLET BY MOUTH EVERY DAY 30 tablet 11   TURMERIC PO Take by mouth.     No current facility-administered medications on file prior to visit.    BP (!) 170/98   Pulse 64   Resp 18   Ht 5\' 6"  (1.676 m)   Wt 149 lb (67.6 kg)   LMP 03/11/2016   SpO2 100%   BMI 24.05 kg/m        Objective:   Physical Exam  General Mental Status- Alert. General Appearance- Not in acute  distress.   Skin General: Color- Normal Color. Moisture- Normal Moisture.  Neck Carotid Arteries- Normal color. Moisture- Normal Moisture. No carotid bruits. No JVD.  Chest and Lung Exam Auscultation: Breath Sounds:-Normal.  Cardiovascular Auscultation:Rythm- Regular. Murmurs & Other Heart Sounds:Auscultation of the heart reveals- No Murmurs.  Abdomen Inspection:-Inspeection Normal. Palpation/Percussion:Note:No mass. Palpation and Percussion of the abdomen reveal- Non Tender, Non Distended + BS, no rebound or guarding.    Neurologic Cranial Nerve exam:- CN III-XII intact(No nystagmus), symmetric smile. Drift Test:- No drift. Romberg Exam:- Negative.  Heal to Toe Gait exam:-Normal. Finger to Nose:- Normal/Intact Strength:- 5/5 equal and symmetric strength both upper and lower extremities.   Bilateral hands- negative phalens and tinnel. Left side negative finklestein test. No obvious tendernss presently but pt point to base of thumbs as location of most of pain when typing.    Assessment & Plan:   Patient Instructions  For you wellness exam today I have ordered cbc, cmp and  lipid panel.  Vaccine given today tdap and flu vaccine.  Recommend shingrix vaccine 1-2 weeks. Can do as nurse visit. It is 2 shot series. 2nd vaccine 1-6 months after the first.  Recommend exercise and healthy diet.  We will let you know lab results as they come in.  Follow up date appointment will be determined after lab review.    Htn- bp elevated today. Rechecked twice. Normal neurologic exam. Continue benicar hydrochlorothiazide and add on amlodipine 10 mg daily. Rx advisement given. If you get any cardiac or neurologic signs/symptoms be seen in ED. Follow with me in 10 days for bp check.  Left hand /thumb pain. Xray of left hand/thumb. Can use tylenol for pain. But no nsaids since bp elevated.    Esperanza Richters, [place]    [id number] charge in additon to wellness exam. Addressed htn. Added on new  med. Also addressed hand/thumb pain. Ordered xray today and advised on treatment

## 2023-01-12 NOTE — Patient Instructions (Addendum)
For you wellness exam today I have ordered cbc, cmp and  lipid panel.  Vaccine given today tdap and flu vaccine.  Recommend shingrix vaccine 1-2 weeks. Can do as nurse visit. It is 2 shot series. 2nd vaccine 1-6 months after the first.  Recommend exercise and healthy diet.  We will let you know lab results as they come in.  Follow up date appointment will be determined after lab review.    Htn- bp elevated today. Rechecked twice. Normal neurologic exam. Continue benicar hydrochlorothiazide and add on amlodipine 10 mg daily. Rx advisement given. If you get any cardiac or neurologic signs/symptoms be seen in ED. Follow with me in 10 days for bp check.  Left hand /thumb pain. Xray of left hand/thumb. Can use tylenol for pain. But no nsaids since bp elevated.

## 2023-01-12 NOTE — Addendum Note (Signed)
Addended by: Gwenevere Abbot on: 01/12/2023 09:35 AM   Modules accepted: Orders

## 2023-01-24 ENCOUNTER — Inpatient Hospital Stay (HOSPITAL_BASED_OUTPATIENT_CLINIC_OR_DEPARTMENT_OTHER): Admission: RE | Admit: 2023-01-24 | Payer: 59 | Source: Ambulatory Visit

## 2023-01-25 ENCOUNTER — Ambulatory Visit (INDEPENDENT_AMBULATORY_CARE_PROVIDER_SITE_OTHER): Payer: 59

## 2023-01-25 DIAGNOSIS — Z23 Encounter for immunization: Secondary | ICD-10-CM

## 2023-01-25 NOTE — Progress Notes (Signed)
Abigail Johnson is a 62 y.o. female presents to the office today for presents to the office today for Shingrix #1 injection, per physician's orders. Original order: 01/12/2023 Shingrix 0.5 mL IM, was administered L Deltoid today. Patient tolerated injection. Patient due for follow up labs/provider appt: No.  Patient next injection due: 2-6 months. appt made Yes   Creft, Melton Alar L

## 2023-01-31 ENCOUNTER — Encounter (HOSPITAL_BASED_OUTPATIENT_CLINIC_OR_DEPARTMENT_OTHER): Payer: Self-pay

## 2023-01-31 ENCOUNTER — Ambulatory Visit (HOSPITAL_BASED_OUTPATIENT_CLINIC_OR_DEPARTMENT_OTHER)
Admission: RE | Admit: 2023-01-31 | Discharge: 2023-01-31 | Disposition: A | Discharge status: Home / Self Care | Payer: 59 | Source: Ambulatory Visit | Attending: Medical | Admitting: Medical

## 2023-01-31 DIAGNOSIS — M79642 Pain in left hand: Secondary | ICD-10-CM | POA: Insufficient documentation

## 2023-01-31 DIAGNOSIS — Z1231 Encounter for screening mammogram for malignant neoplasm of breast: Secondary | ICD-10-CM

## 2023-01-31 DIAGNOSIS — M79645 Pain in left finger(s): Secondary | ICD-10-CM | POA: Diagnosis present

## 2023-02-02 ENCOUNTER — Other Ambulatory Visit: Payer: Self-pay | Admitting: Medical

## 2023-02-02 DIAGNOSIS — R928 Other abnormal and inconclusive findings on diagnostic imaging of breast: Secondary | ICD-10-CM

## 2023-02-04 NOTE — Addendum Note (Signed)
Addended by: Gwenevere Abbot on: 02/04/2023 06:36 AM   Modules accepted: Orders

## 2023-02-15 ENCOUNTER — Ambulatory Visit: Payer: 59 | Admitting: Orthopaedic Surgery

## 2023-02-15 DIAGNOSIS — M1812 Unilateral primary osteoarthritis of first carpometacarpal joint, left hand: Secondary | ICD-10-CM | POA: Diagnosis not present

## 2023-02-15 NOTE — Progress Notes (Signed)
Office Visit Note   Patient: Abigail Johnson           Date of Birth: 1960-06-22           MRN: [id number] Visit Date: 02/15/2023              Requested by: Esperanza Richters, PA-C [id number] [place],  [place] [address] PCP: Esperanza Richters, PA-C   Assessment & Plan: Visit Diagnoses:  1. Primary osteoarthritis of first carpometacarpal joint of left hand     Plan: [name] is a 62 year old female with bilateral thumb CMC osteoarthritis worse on the left.  Disease process explained and treatment options were discussed.  We will start with conservative management with a CMC brace and Voltaren gel and oral NSAIDs as needed.  Activity modifications as needed.  Follow-up if symptoms do not improve.  Follow-Up Instructions: No follow-ups on file.   Orders:  No orders of the defined types were placed in this encounter.  No orders of the defined types were placed in this encounter.     Procedures: No procedures performed   Clinical Data: No additional findings.   Subjective: Chief Complaint  Patient presents with   Left Hand - Pain    HPI Abigail Johnson is a very pleasant 63 year old female referral from PCP for evaluation of bilateral hand and thumb pain greater on the left.  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  She has been doing this for the last 18 years.  Denies any numbness or tingling.  Has trouble and pain with opening jars and grasping tightly.  Review of Systems  Constitutional: Negative.   HENT: Negative.    Eyes: Negative.   Respiratory: Negative.    Cardiovascular: Negative.   Endocrine: Negative.   Musculoskeletal: Negative.   Neurological: Negative.   Hematological: Negative.   Psychiatric/Behavioral: Negative.    All other systems reviewed and are negative.    Objective: Vital Signs: LMP 03/11/2016   Physical Exam Vitals and nursing note reviewed.  Constitutional:      Appearance: She is well-developed.   HENT:     Head: Atraumatic.     Nose: Nose normal.  Eyes:     Extraocular Movements: Extraocular movements intact.  Cardiovascular:     Pulses: Normal pulses.  Pulmonary:     Effort: Pulmonary effort is normal.  Abdominal:     Palpations: Abdomen is soft.  Musculoskeletal:     Cervical back: Neck supple.  Skin:    General: Skin is warm.     Capillary Refill: Capillary refill takes less than 2 seconds.  Neurological:     Mental Status: She is alert. Mental status is at baseline.  Psychiatric:        Behavior: Behavior normal.        Thought Content: Thought content normal.        Judgment: Judgment normal.     Ortho Exam Exam of the left hand shows mild pain and crepitus with CMC grind test of the thumb.  No carpal tunnel compressive signs.  No muscle atrophy. Specialty Comments:  No specialty comments available.  Imaging: X-rays in PACS independently reviewed and interpreted shows nearly bone-on-bone left thumb CMC joint.   PMFS History: Patient Active Problem List   Diagnosis Date Noted   Primary osteoarthritis of first carpometacarpal joint of left hand 02/15/2023   Physical exam 02/28/2015   UTI (urinary tract infection) 05/22/2014   Abnormal  second heart sound (S2) 05/24/2013   HTN (hypertension) 05/23/2013   Bug bites 05/23/2013   Past Medical History:  Diagnosis Date   Anxiety    Blood transfusion complicating pregnancy    Heart murmur    History of chicken pox    Hypertension    UTI (urinary tract infection)     Family History  Problem Relation Age of Onset   Hypertension Mother    Diabetes Father    Prostate cancer Paternal Grandfather    Colon cancer Neg Hx    Colon polyps Neg Hx    Esophageal cancer Neg Hx    Rectal cancer Neg Hx    Stomach cancer Neg Hx     Past Surgical History:  Procedure Laterality Date   ECTOPIC PREGNANCY SURGERY     TONSILLECTOMY     age 84    Social History   Occupational History   Not on file  Tobacco Use    Smoking status: Never   Smokeless tobacco: Never  Substance and Sexual Activity   Alcohol use: Yes    Comment: occ wine    Drug use: No   Sexual activity: Yes

## 2023-02-25 ENCOUNTER — Other Ambulatory Visit: Payer: 59

## 2023-03-14 ENCOUNTER — Ambulatory Visit
Admission: RE | Admit: 2023-03-14 | Discharge: 2023-03-14 | Disposition: A | Discharge status: Home / Self Care | Payer: 59 | Source: Ambulatory Visit | Attending: Medical | Admitting: Medical

## 2023-03-14 DIAGNOSIS — R928 Other abnormal and inconclusive findings on diagnostic imaging of breast: Secondary | ICD-10-CM

## 2023-03-29 ENCOUNTER — Ambulatory Visit (INDEPENDENT_AMBULATORY_CARE_PROVIDER_SITE_OTHER): Payer: 59

## 2023-03-29 DIAGNOSIS — Z23 Encounter for immunization: Secondary | ICD-10-CM | POA: Diagnosis not present

## 2023-03-29 NOTE — Progress Notes (Signed)
Abigail Johnson is a 62 y.o. female presents to the office today for Shingrix #2: injection, per physician's orders. Original order: 01/12/2023 Shingrix 0.5 mL,  IM was administered L Deltoid today. Patient tolerated injection. Patient due for follow up labs provider appt: No.  Patient next injection due: n/a series complete  Creft, Melton Alar L

## 2023-11-03 ENCOUNTER — Telehealth: Payer: Self-pay

## 2023-11-03 NOTE — Telephone Encounter (Signed)
 Copied from CRM [id number]. Topic: Clinical - Medication Question >> Nov 03, 2023  2:35 PM Chiquita SQUIBB wrote: Reason for CRM: Patient is calling to see if Dallas Maxwell would write her a script for medical massages for her stress. Patient states if she has this they will be paid for through her FSA. Please advise the patient

## 2023-11-04 NOTE — Telephone Encounter (Signed)
Pt notified prescription at front desk.

## 2024-01-08 ENCOUNTER — Other Ambulatory Visit: Payer: Self-pay | Admitting: Medical
